# Patient Record
Sex: Female | Born: 1975 | Race: Black or African American | Hispanic: No | Smoking: Former smoker
Health system: Southern US, Community
[De-identification: ages and names within clinical notes are randomized; demographics above are authoritative.]

## PROBLEM LIST (undated history)

## (undated) DIAGNOSIS — F319 Bipolar disorder, unspecified: Secondary | ICD-10-CM

## (undated) DIAGNOSIS — F32A Depression, unspecified: Secondary | ICD-10-CM

## (undated) DIAGNOSIS — F329 Major depressive disorder, single episode, unspecified: Secondary | ICD-10-CM

## (undated) DIAGNOSIS — F4010 Social phobia, unspecified: Secondary | ICD-10-CM

## (undated) DIAGNOSIS — M1990 Unspecified osteoarthritis, unspecified site: Secondary | ICD-10-CM

## (undated) DIAGNOSIS — F4310 Post-traumatic stress disorder, unspecified: Secondary | ICD-10-CM

## (undated) DIAGNOSIS — E785 Hyperlipidemia, unspecified: Secondary | ICD-10-CM

## (undated) HISTORY — PX: TOOTH EXTRACTION: SUR596

## (undated) HISTORY — DX: Hyperlipidemia, unspecified: E78.5

## (undated) HISTORY — PX: TONSILLECTOMY: SUR1361

---

## 1998-07-30 ENCOUNTER — Emergency Department (HOSPITAL_COMMUNITY): Admission: EM | Admit: 1998-07-30 | Discharge: 1998-07-30 | Payer: Self-pay | Admitting: Emergency Medicine

## 1999-05-27 ENCOUNTER — Emergency Department (HOSPITAL_COMMUNITY): Admission: EM | Admit: 1999-05-27 | Discharge: 1999-05-27 | Payer: Self-pay | Admitting: Emergency Medicine

## 1999-05-27 ENCOUNTER — Encounter: Payer: Self-pay | Admitting: Emergency Medicine

## 1999-08-05 ENCOUNTER — Ambulatory Visit (HOSPITAL_COMMUNITY): Admission: RE | Admit: 1999-08-05 | Discharge: 1999-08-05 | Payer: Self-pay | Admitting: *Deleted

## 1999-10-20 ENCOUNTER — Ambulatory Visit (HOSPITAL_COMMUNITY): Admission: RE | Admit: 1999-10-20 | Discharge: 1999-10-20 | Payer: Self-pay | Admitting: Obstetrics

## 2000-02-10 ENCOUNTER — Inpatient Hospital Stay (HOSPITAL_COMMUNITY): Admission: AD | Admit: 2000-02-10 | Discharge: 2000-02-10 | Payer: Self-pay | Admitting: *Deleted

## 2000-02-13 ENCOUNTER — Encounter (HOSPITAL_COMMUNITY): Admission: RE | Admit: 2000-02-13 | Discharge: 2000-02-17 | Payer: Self-pay | Admitting: Obstetrics & Gynecology

## 2000-02-16 ENCOUNTER — Inpatient Hospital Stay (HOSPITAL_COMMUNITY): Admission: AD | Admit: 2000-02-16 | Discharge: 2000-02-20 | Payer: Self-pay | Admitting: Obstetrics

## 2000-02-16 ENCOUNTER — Encounter (INDEPENDENT_AMBULATORY_CARE_PROVIDER_SITE_OTHER): Payer: Self-pay

## 2000-02-16 ENCOUNTER — Inpatient Hospital Stay (HOSPITAL_COMMUNITY): Admission: AD | Admit: 2000-02-16 | Discharge: 2000-02-16 | Payer: Self-pay | Admitting: Obstetrics

## 2000-02-23 ENCOUNTER — Inpatient Hospital Stay (HOSPITAL_COMMUNITY): Admission: AD | Admit: 2000-02-23 | Discharge: 2000-02-23 | Payer: Self-pay | Admitting: *Deleted

## 2000-04-17 ENCOUNTER — Emergency Department (HOSPITAL_COMMUNITY): Admission: EM | Admit: 2000-04-17 | Discharge: 2000-04-17 | Payer: Self-pay | Admitting: Emergency Medicine

## 2001-09-20 ENCOUNTER — Emergency Department (HOSPITAL_COMMUNITY): Admission: EM | Admit: 2001-09-20 | Discharge: 2001-09-20 | Payer: Self-pay | Admitting: Emergency Medicine

## 2001-09-30 ENCOUNTER — Emergency Department (HOSPITAL_COMMUNITY): Admission: EM | Admit: 2001-09-30 | Discharge: 2001-09-30 | Payer: Self-pay | Admitting: Emergency Medicine

## 2002-03-12 ENCOUNTER — Emergency Department (HOSPITAL_COMMUNITY): Admission: EM | Admit: 2002-03-12 | Discharge: 2002-03-12 | Payer: Self-pay | Admitting: Emergency Medicine

## 2002-04-26 ENCOUNTER — Encounter: Payer: Self-pay | Admitting: Obstetrics and Gynecology

## 2002-04-26 ENCOUNTER — Inpatient Hospital Stay (HOSPITAL_COMMUNITY): Admission: AD | Admit: 2002-04-26 | Discharge: 2002-04-26 | Payer: Self-pay | Admitting: Obstetrics and Gynecology

## 2002-08-01 ENCOUNTER — Inpatient Hospital Stay (HOSPITAL_COMMUNITY): Admission: AD | Admit: 2002-08-01 | Discharge: 2002-08-01 | Payer: Self-pay | Admitting: *Deleted

## 2002-08-01 ENCOUNTER — Encounter: Payer: Self-pay | Admitting: *Deleted

## 2002-08-02 ENCOUNTER — Inpatient Hospital Stay (HOSPITAL_COMMUNITY): Admission: AD | Admit: 2002-08-02 | Discharge: 2002-08-02 | Payer: Self-pay | Admitting: Obstetrics and Gynecology

## 2002-08-03 ENCOUNTER — Inpatient Hospital Stay (HOSPITAL_COMMUNITY): Admission: AD | Admit: 2002-08-03 | Discharge: 2002-08-03 | Payer: Self-pay | Admitting: *Deleted

## 2002-08-07 ENCOUNTER — Ambulatory Visit (HOSPITAL_COMMUNITY): Admission: RE | Admit: 2002-08-07 | Discharge: 2002-08-07 | Payer: Self-pay | Admitting: *Deleted

## 2002-09-09 ENCOUNTER — Inpatient Hospital Stay (HOSPITAL_COMMUNITY): Admission: AD | Admit: 2002-09-09 | Discharge: 2002-09-09 | Payer: Self-pay | Admitting: *Deleted

## 2002-09-15 ENCOUNTER — Inpatient Hospital Stay (HOSPITAL_COMMUNITY): Admission: AD | Admit: 2002-09-15 | Discharge: 2002-09-15 | Payer: Self-pay | Admitting: Obstetrics and Gynecology

## 2002-09-17 ENCOUNTER — Encounter: Payer: Self-pay | Admitting: Obstetrics and Gynecology

## 2002-09-17 ENCOUNTER — Inpatient Hospital Stay (HOSPITAL_COMMUNITY): Admission: AD | Admit: 2002-09-17 | Discharge: 2002-09-17 | Payer: Self-pay | Admitting: Obstetrics and Gynecology

## 2002-09-21 ENCOUNTER — Encounter (HOSPITAL_COMMUNITY): Admission: RE | Admit: 2002-09-21 | Discharge: 2002-09-23 | Payer: Self-pay | Admitting: *Deleted

## 2002-09-25 ENCOUNTER — Inpatient Hospital Stay (HOSPITAL_COMMUNITY): Admission: AD | Admit: 2002-09-25 | Discharge: 2002-09-28 | Payer: Self-pay | Admitting: *Deleted

## 2002-09-25 ENCOUNTER — Encounter (INDEPENDENT_AMBULATORY_CARE_PROVIDER_SITE_OTHER): Payer: Self-pay

## 2002-10-02 ENCOUNTER — Inpatient Hospital Stay (HOSPITAL_COMMUNITY): Admission: AD | Admit: 2002-10-02 | Discharge: 2002-10-02 | Payer: Self-pay | Admitting: *Deleted

## 2006-04-10 ENCOUNTER — Emergency Department (HOSPITAL_COMMUNITY): Admission: EM | Admit: 2006-04-10 | Discharge: 2006-04-10 | Payer: Self-pay | Admitting: Emergency Medicine

## 2006-07-18 ENCOUNTER — Emergency Department (HOSPITAL_COMMUNITY): Admission: EM | Admit: 2006-07-18 | Discharge: 2006-07-18 | Payer: Self-pay | Admitting: Emergency Medicine

## 2007-11-16 ENCOUNTER — Emergency Department (HOSPITAL_COMMUNITY): Admission: EM | Admit: 2007-11-16 | Discharge: 2007-11-16 | Payer: Self-pay | Admitting: Emergency Medicine

## 2008-08-02 ENCOUNTER — Emergency Department (HOSPITAL_COMMUNITY): Admission: EM | Admit: 2008-08-02 | Discharge: 2008-08-02 | Payer: Self-pay | Admitting: Emergency Medicine

## 2010-01-15 ENCOUNTER — Emergency Department (HOSPITAL_COMMUNITY): Admission: EM | Admit: 2010-01-15 | Discharge: 2010-01-17 | Payer: Self-pay | Admitting: Emergency Medicine

## 2010-01-17 ENCOUNTER — Ambulatory Visit: Payer: Self-pay | Admitting: Psychiatry

## 2010-01-17 ENCOUNTER — Inpatient Hospital Stay (HOSPITAL_COMMUNITY): Admission: EM | Admit: 2010-01-17 | Discharge: 2010-01-22 | Payer: Self-pay | Admitting: Psychiatry

## 2010-04-28 ENCOUNTER — Emergency Department (HOSPITAL_COMMUNITY): Admission: EM | Admit: 2010-04-28 | Discharge: 2010-04-28 | Payer: Self-pay | Admitting: Emergency Medicine

## 2010-11-18 ENCOUNTER — Emergency Department (HOSPITAL_COMMUNITY): Admission: EM | Admit: 2010-11-18 | Discharge: 2010-11-18 | Payer: Self-pay | Admitting: Emergency Medicine

## 2011-03-11 ENCOUNTER — Emergency Department (HOSPITAL_COMMUNITY): Payer: Self-pay

## 2011-03-11 ENCOUNTER — Emergency Department (HOSPITAL_COMMUNITY)
Admission: EM | Admit: 2011-03-11 | Discharge: 2011-03-11 | Disposition: A | Discharge status: Home / Self Care | Payer: Self-pay | Attending: Emergency Medicine | Admitting: Emergency Medicine

## 2011-03-11 DIAGNOSIS — F319 Bipolar disorder, unspecified: Secondary | ICD-10-CM | POA: Insufficient documentation

## 2011-03-11 DIAGNOSIS — I10 Essential (primary) hypertension: Secondary | ICD-10-CM | POA: Insufficient documentation

## 2011-03-11 DIAGNOSIS — R109 Unspecified abdominal pain: Secondary | ICD-10-CM | POA: Insufficient documentation

## 2011-03-11 DIAGNOSIS — R1110 Vomiting, unspecified: Secondary | ICD-10-CM | POA: Insufficient documentation

## 2011-03-11 DIAGNOSIS — R197 Diarrhea, unspecified: Secondary | ICD-10-CM | POA: Insufficient documentation

## 2011-03-11 LAB — COMPREHENSIVE METABOLIC PANEL
ALT: 21 U/L (ref 0–35)
AST: 17 U/L (ref 0–37)
Albumin: 3.9 g/dL (ref 3.5–5.2)
Alkaline Phosphatase: 54 U/L (ref 39–117)
BUN: 10 mg/dL (ref 6–23)
Chloride: 107 mEq/L (ref 96–112)
Creatinine, Ser: 1.01 mg/dL (ref 0.4–1.2)
GFR calc Af Amer: >60 mL/min (ref >60)
GFR calc non Af Amer: >60 mL/min (ref >60)
Glucose, Bld: 81 mg/dL (ref 70–99)
Total Bilirubin: 0.4 mg/dL (ref 0.3–1.2)
Total Protein: 7.7 g/dL (ref 6.0–8.3)

## 2011-03-11 LAB — POCT PREGNANCY, URINE
Preg Test, Ur: NEGATIVE
Preg Test, Ur: NEGATIVE

## 2011-03-11 LAB — URINALYSIS, ROUTINE W REFLEX MICROSCOPIC
Glucose, UA: NEGATIVE mg/dL
Ketones, ur: NEGATIVE mg/dL
Leukocytes, UA: NEGATIVE
Specific Gravity, Urine: 1.026 (ref 1.005–1.030)
Urobilinogen, UA: 0.2 mg/dL (ref 0.0–1.0)

## 2011-03-11 LAB — CBC
MCHC: 34.3 g/dL (ref 30.0–36.0)
MCV: 96.9 fL (ref 78.0–100.0)
Platelets: 280 10*3/uL (ref 150–400)
RDW: 12.6 % (ref 11.5–15.5)

## 2011-03-11 LAB — DIFFERENTIAL: Basophils Absolute: 0 10*3/uL (ref 0.0–0.1)

## 2011-03-16 LAB — BASIC METABOLIC PANEL
CO2: 23 mEq/L (ref 19–32)
Calcium: 8.8 mg/dL (ref 8.4–10.5)
GFR calc Af Amer: >60 mL/min (ref >60)
GFR calc non Af Amer: >60 mL/min (ref >60)

## 2011-03-16 LAB — DIFFERENTIAL
Basophils Absolute: 0 10*3/uL (ref 0.0–0.1)
Eosinophils Absolute: 0 10*3/uL (ref 0.0–0.7)
Eosinophils Relative: 1 % (ref 0–5)
Lymphocytes Relative: 26 % (ref 12–46)
Monocytes Absolute: 0.3 10*3/uL (ref 0.1–1.0)
Monocytes Relative: 6 % (ref 3–12)

## 2011-03-16 LAB — TRICYCLICS SCREEN, URINE: TCA Scrn: NOT DETECTED

## 2011-03-16 LAB — CBC
Hemoglobin: 14.1 g/dL (ref 12.0–15.0)
Platelets: 259 10*3/uL (ref 150–400)
RBC: 4.22 MIL/uL (ref 3.87–5.11)
RDW: 14.2 % (ref 11.5–15.5)

## 2011-03-16 LAB — URINE MICROSCOPIC-ADD ON

## 2011-03-16 LAB — URINALYSIS, ROUTINE W REFLEX MICROSCOPIC
Bilirubin Urine: NEGATIVE
Glucose, UA: NEGATIVE mg/dL
Protein, ur: NEGATIVE mg/dL

## 2011-03-16 LAB — RAPID URINE DRUG SCREEN, HOSP PERFORMED
Amphetamines: NOT DETECTED
Barbiturates: NOT DETECTED

## 2011-03-16 LAB — PREGNANCY, URINE: Preg Test, Ur: NEGATIVE

## 2011-03-16 LAB — LIPID PANEL: Total CHOL/HDL Ratio: 3.4 RATIO

## 2011-03-17 LAB — CBC
HCT: 37.5 % (ref 36.0–46.0)
MCHC: 33.8 g/dL (ref 30.0–36.0)
RDW: 14.1 % (ref 11.5–15.5)

## 2011-03-17 LAB — URINALYSIS, ROUTINE W REFLEX MICROSCOPIC
Glucose, UA: NEGATIVE mg/dL
Leukocytes, UA: NEGATIVE
Nitrite: NEGATIVE
Protein, ur: NEGATIVE mg/dL
Specific Gravity, Urine: 1.017 (ref 1.005–1.030)
pH: 5.5 (ref 5.0–8.0)

## 2011-03-17 LAB — DIFFERENTIAL
Eosinophils Relative: 1 % (ref 0–5)
Lymphocytes Relative: 15 % (ref 12–46)
Lymphs Abs: 1.2 10*3/uL (ref 0.7–4.0)
Neutro Abs: 6.5 10*3/uL (ref 1.7–7.7)

## 2011-03-17 LAB — POCT I-STAT, CHEM 8
BUN: 11 mg/dL (ref 6–23)
Calcium, Ion: 1.13 mmol/L (ref 1.12–1.32)
Creatinine, Ser: 1.3 mg/dL — ABNORMAL HIGH (ref 0.4–1.2)
Glucose, Bld: 90 mg/dL (ref 70–99)
HCT: 40 % (ref 36.0–46.0)
TCO2: 22 mmol/L (ref 0–100)

## 2011-03-17 LAB — URINE MICROSCOPIC-ADD ON

## 2011-03-17 LAB — RAPID URINE DRUG SCREEN, HOSP PERFORMED
Benzodiazepines: NOT DETECTED
Cocaine: NOT DETECTED
Opiates: NOT DETECTED

## 2011-03-17 LAB — POCT PREGNANCY, URINE: Preg Test, Ur: NEGATIVE

## 2011-05-15 NOTE — Discharge Summary (Signed)
   [name], Grace Carson                         ACCOUNT NO.:  [id number]   MEDICAL RECORD NO.:  [id number]                   PATIENT TYPE:  INP   LOCATION:  9101                                 FACILITY:  [facility]   PHYSICIAN:  Mary Sella. Orlene Erm, M.D.                 DATE OF BIRTH:  02/27/1976   DATE OF ADMISSION:  09/25/2002  DATE OF DISCHARGE:  09/28/2002                                 DISCHARGE SUMMARY   DISCHARGE DIAGNOSES:  1. Status post low transverse cesarean section.  2. Delivery of a viable female infant.  3. Status post bilateral tubal ligation.   DISCHARGE MEDICATIONS:  1. Ibuprofen 600 mg p.o. q.6h. p.r.n. pain.  2. Percocet 5/325 one to two tablets p.o. q.4-6h. p.r.n. severe pain.  3. Prenatal vitamins one tablet p.o. q.d. x6 weeks or while breast-feeding.   ADMISSION HISTORY AND PHYSICAL:  A 35 year old G3, P2 presenting at 58 and [redacted]  weeks gestation for follow-up for an unreactive NST.  Pregnancy complicated  by anemia of pregnancy, history of Chlamydia.   PRENATAL LABORATORIES:  B+.  Antibody negative.  Rubella immune.  Hepatitis  B surface antigen negative.  RPR negative.  HIV negative.  GC negative.  Chlamydia negative.  GBS positive.   HOSPITAL COURSE:  The patient admitted for a repeat LTCS and BTL as she was  in labor on admission.  The patient tolerated the procedure well and did  have a BTL at the same time as delivery of her infant.  Baby was circumcised  prior to discharge.  The patient did well throughout her hospital stay and  did have a scheduled staple removal in the MAU on Monday, October 6.  The  patient developed no other postpartum complications.   DISCHARGE LABORATORIES:  Surgical pathology shows two fallopian tubes with  complete transections and no pathological abnormalities.  WBC 9.5,  hemoglobin 10.9, platelets 205,000.  RPR negative.  Ultrasound on September  29 showed a grade 2 placenta with normal AFI and a cephalic presentation.  The patient  discharged to home without further incident.     Jonah Blue, M.D.                      Mary Sella. Orlene Erm, M.D.    Milas Gain  D:  10/22/2002  T:  10/23/2002  Job:  [id number]   cc:   [facility]

## 2011-05-15 NOTE — Op Note (Signed)
[facility]  Patient:    Grace Carson, Grace Carson                      MRN: [id number] Proc. Date: 02/17/00 Adm. Date:  [id number] Disc. Date: [id number] Attending:  Osvaldo Human                           Operative Report  PREOPERATIVE DIAGNOSIS:       Intrauterine pregnancy at term with nonreassuring  fetal heart rate and prior cesarean delivery.  POSTOPERATIVE DIAGNOSIS:      Intrauterine pregnancy at term with nonreassuring  fetal heart rate and prior cesarean delivery.  OPERATION:                    Low transverse cesarean.  SURGEON:                      Conni Elliot, M.D. and Dr. Philis Fendt.  ANESTHESIA:                   ______ epidural.  OPERATIVE FINDINGS:           Female infant with 7 pounds 15 ounces with Apgars of 8 and 9 at one and five minutes respectively.  Cord pH 7.28.  Placenta was sent to pathology.  PROCEDURE:                    After the patient was supine in left tilt position receiving oxygen, was prepped and draped in a sterile fashion.  Low transverse Pfannenstiel incision was made.  Incision made through the skin, fascia. Rectus muscles separated in the midline.  ______.  Bladder flap created.  Low transuterine incision was made.  Baby delivered in vertex presentation.  Cord double clamped and cut and baby handed to the neonatologists in attendance. Placenta was delivered spontaneously.  Uterus, bladder flap, ______, fascia, skin closed in a routine fashion.  Estimated blood loss approximately 500-750 cc. Needle, sponge were correct. DD:  04/25/00 TD:  04/25/00 Job: [id number]

## 2011-05-15 NOTE — Discharge Summary (Signed)
[facility]  Patient:    TRISTYN, PHARRIS                      MRN: [id number] Adm. Date:  [id number] Disc. Date: [id number] Attending:  Osvaldo Human Dictator:   Brandt Loosen, M.D.                           Discharge Summary  DISCHARGE DIAGNOSES:          1. Intrauterine pregnancy.                               2. Delivered via repeat cesarean section.                               3. Induction of labor for nonreassuring nonstress                                  test.  HOSPITAL COURSE:              This gravida 2, para 1-0-0-1, at 41-1/7 weeks was  admitted with a nonreactive NST at [facility].  She was admitted to the labor and delivery floor and given prostin gel x 2 overnight and then begun on low dose Pitocin per low dose Pitocin protocol.  She was continued on the Pitocin.  She continued to have nonreassuring fetal heart tones and because of her slow progression, it was decided to perform a repeat cesarean section as she was a VBAC. She had a viable infant female that was delivered.  Apgars were 8 at one minute and 9 at five minutes.  Weight was 7 pounds 15 ounces and stable.  Postoperative course was unremarkable.  Staples were removed.  She was bottlefeeding and using oral contraceptives for birth control at discharge. DD:  06/11/00 TD:  06/15/00 Job: [id number]

## 2011-05-15 NOTE — Op Note (Signed)
Grace Carson, Grace Carson                         ACCOUNT NO.:  [id number]   MEDICAL RECORD NO.:  [id number]                   PATIENT TYPE:  INP   LOCATION:  9101                                 FACILITY:  [facility]   PHYSICIAN:  Mary Sella. Orlene Erm, M.D.                 DATE OF BIRTH:  1976/07/23   DATE OF PROCEDURE:  09/25/2002  DATE OF DISCHARGE:                                 OPERATIVE REPORT   PREOPERATIVE DIAGNOSES:  A 35 year old gravida 3, para 2 black female with  two previous cesarean sections in active labor desiring repeat cesarean  section and tubal ligation.   POSTOPERATIVE DIAGNOSES:  A 35 year old gravida 3, para 2 black female with  two previous cesarean sections in active labor desiring repeat cesarean  section and tubal ligation.   PROCEDURE:  1. Repeat low transverse cesarean section.  2. Bilateral tubal ligation.   SURGEON:  Mary Sella. Orlene Erm, M.D.   ASSISTANT:  Alvira Philips, M.D.   ANESTHESIA:  Spinal.   COMPLICATIONS:  None.   ESTIMATED BLOOD LOSS:  500 cc.   FINDINGS:  Viable female infant delivered with Apgars of 8 and 9.  Normal  uterus, tubes, and ovaries.  Dense fascial scarring.   PROCEDURE:  The patient was taken to the operating room where she was given  spinal anesthesia.  She was prepped and draped in a sterile fashion.  A  Pfannenstiel incision was performed with the scalpel and carried down to  underlying fascia.  Fascia was Bovied.  The fascia was entered sharply and  dissected laterally with Mayo scissors.  The fascia was carefully dissected  away from the underlying rectus muscle bellies with sharp and blunt  dissection.  A Hemostat was used to spread the rectus muscle bellies and  identify a peritoneal window.  The peritoneum was entered sharply.  The  midline scar was then dissected superiorly and inferiorly with the Bovie and  Mayo scissors.  The bladder blade was placed and bladder flap was created  with sharp and blunt dissection.  The bladder  blade was replaced and the  uterus was scored with a scalpel blade.  The uterine incision was carried  down on the midline with the scalpel and the surgeon's fingers.  The uterus  was entered sharply and clear amniotic fluid was found.  The infant's head  was delivered through the operative field.  The muscle bellies were quite  scarred and there was dystocia of the infant's head through the rectus  muscle bellies.  A vacuum was placed on the infant's occiput and the head  was easily delivered through the uterine incision.  The infant was bulb  suctioned on the operative field.  The infant's cord was clamped and cut and  the infant was handed to the awaiting neonatal resuscitation team.  The  placenta was then manually extracted and the uterus was curetted with a dry  lap sponge.  The uterus was externalized and the uterine incision was  repaired with a running locking suture of 0 chromic.  Attention was then  turned to the patient's fallopian tubes and the fallopian tubes were grasped  with a Babcock clamp and the left tube was doubly ligated with 0 plain gut  sutures.  A 1 cm segment of tube was excised.  The patient's right fallopian  tube was ligated in a similar fashion.  A 1 cm segment of tube was excised.  The uterus was returned to the abdominal cavity and the tubal sites were  inspected and noted to be hemostatic.  The pericolic gutters were emptied of  clot and debris.  The uterine incision was inspected and made hemostatic.  The fascia was closed with a running stitch of 0 Vicryl.  The subcutaneous  tissues were irrigated with copious amounts of saline.  Skin edges were  reapproximated with staples.  Sponge, instrument, and needle counts were  correct at the end of the case.                                               Mary Sella. Orlene Erm, M.D.    EMH/MEDQ  D:  09/25/2002  T:  09/26/2002  Job:  [id number]

## 2011-09-24 ENCOUNTER — Emergency Department (HOSPITAL_COMMUNITY)
Admission: EM | Admit: 2011-09-24 | Discharge: 2011-09-24 | Disposition: A | Discharge status: Home / Self Care | Payer: Self-pay | Attending: Emergency Medicine | Admitting: Emergency Medicine

## 2011-09-24 DIAGNOSIS — R221 Localized swelling, mass and lump, neck: Secondary | ICD-10-CM | POA: Insufficient documentation

## 2011-09-24 DIAGNOSIS — Z79899 Other long term (current) drug therapy: Secondary | ICD-10-CM | POA: Insufficient documentation

## 2011-09-24 DIAGNOSIS — F3130 Bipolar disorder, current episode depressed, mild or moderate severity, unspecified: Secondary | ICD-10-CM | POA: Insufficient documentation

## 2011-09-24 DIAGNOSIS — I10 Essential (primary) hypertension: Secondary | ICD-10-CM | POA: Insufficient documentation

## 2011-09-24 DIAGNOSIS — R220 Localized swelling, mass and lump, head: Secondary | ICD-10-CM | POA: Insufficient documentation

## 2011-09-24 DIAGNOSIS — X58XXXA Exposure to other specified factors, initial encounter: Secondary | ICD-10-CM | POA: Insufficient documentation

## 2011-09-24 DIAGNOSIS — R599 Enlarged lymph nodes, unspecified: Secondary | ICD-10-CM | POA: Insufficient documentation

## 2011-09-24 DIAGNOSIS — I889 Nonspecific lymphadenitis, unspecified: Secondary | ICD-10-CM | POA: Insufficient documentation

## 2011-09-24 DIAGNOSIS — IMO0002 Reserved for concepts with insufficient information to code with codable children: Secondary | ICD-10-CM | POA: Insufficient documentation

## 2011-09-24 LAB — BASIC METABOLIC PANEL
BUN: 10 mg/dL (ref 6–23)
CO2: 27 mEq/L (ref 19–32)
Chloride: 105 mEq/L (ref 96–112)
Creatinine, Ser: 0.83 mg/dL (ref 0.50–1.10)
GFR calc Af Amer: >60 mL/min (ref >60)
Potassium: 4.3 mEq/L (ref 3.5–5.1)

## 2011-09-24 LAB — CBC
HCT: 39.4 % (ref 36.0–46.0)
MCV: 95.9 fL (ref 78.0–100.0)
RBC: 4.11 MIL/uL (ref 3.87–5.11)
WBC: 6.4 10*3/uL (ref 4.0–10.5)

## 2011-09-24 LAB — DIFFERENTIAL
Basophils Absolute: 0 10*3/uL (ref 0.0–0.1)
Lymphocytes Relative: 23 % (ref 12–46)
Lymphs Abs: 1.5 10*3/uL (ref 0.7–4.0)
Neutro Abs: 4.3 10*3/uL (ref 1.7–7.7)
Neutrophils Relative %: 67 % (ref 43–77)

## 2011-10-06 LAB — URINE MICROSCOPIC-ADD ON

## 2011-10-06 LAB — URINALYSIS, ROUTINE W REFLEX MICROSCOPIC
Glucose, UA: NEGATIVE
Ketones, ur: NEGATIVE
Nitrite: NEGATIVE
Specific Gravity, Urine: 1.017
pH: 6

## 2012-06-14 ENCOUNTER — Encounter (HOSPITAL_COMMUNITY): Payer: Self-pay | Admitting: *Deleted

## 2012-06-14 ENCOUNTER — Emergency Department (HOSPITAL_COMMUNITY)
Admission: EM | Admit: 2012-06-14 | Discharge: 2012-06-14 | Disposition: A | Discharge status: Home / Self Care | Payer: Medicaid Other | Attending: Emergency Medicine | Admitting: Emergency Medicine

## 2012-06-14 DIAGNOSIS — Z79899 Other long term (current) drug therapy: Secondary | ICD-10-CM | POA: Insufficient documentation

## 2012-06-14 DIAGNOSIS — L0291 Cutaneous abscess, unspecified: Secondary | ICD-10-CM

## 2012-06-14 DIAGNOSIS — L0390 Cellulitis, unspecified: Secondary | ICD-10-CM

## 2012-06-14 DIAGNOSIS — F17200 Nicotine dependence, unspecified, uncomplicated: Secondary | ICD-10-CM | POA: Insufficient documentation

## 2012-06-14 DIAGNOSIS — L02419 Cutaneous abscess of limb, unspecified: Secondary | ICD-10-CM | POA: Insufficient documentation

## 2012-06-14 HISTORY — DX: Social phobia, unspecified: F40.10

## 2012-06-14 MED ORDER — KETOROLAC TROMETHAMINE 60 MG/2ML IM SOLN
60.0000 mg | Freq: Once | INTRAMUSCULAR | Status: AC
Start: 2012-06-14 — End: 2012-06-14
  Administered 2012-06-14: 60 mg via INTRAMUSCULAR
  Filled 2012-06-14: qty 2

## 2012-06-14 MED ORDER — SULFAMETHOXAZOLE-TRIMETHOPRIM 800-160 MG PO TABS: 1.0000 | ORAL_TABLET | Freq: Two times a day (BID) | ORAL | Status: AC

## 2012-06-14 MED ORDER — OXYCODONE-ACETAMINOPHEN 5-325 MG PO TABS
2.0000 | ORAL_TABLET | Freq: Once | ORAL | Status: AC
  Administered 2012-06-14: 2 via ORAL
  Filled 2012-06-14: qty 2

## 2012-06-14 MED ORDER — SULFAMETHOXAZOLE-TRIMETHOPRIM 800-160 MG PO TABS: 1.0000 | ORAL_TABLET | Freq: Two times a day (BID) | ORAL | Status: DC

## 2012-06-14 MED ORDER — LIDOCAINE HCL 2 % IJ SOLN
10.0000 mL | Freq: Once | INTRAMUSCULAR | Status: AC
  Administered 2012-06-14: 20 mg via INTRADERMAL
  Filled 2012-06-14: qty 1

## 2012-06-14 NOTE — ED Provider Notes (Signed)
History     CSN: [id number]  Arrival date & time 06/14/12  4098   First MD Initiated Contact with Patient 06/14/12 1207      Chief Complaint  Patient presents with  . Abscess    R upper thigh     (Consider location/radiation/quality/duration/timing/severity/associated sxs/prior treatment) Patient is a 36 y.o. female presenting with abscess. The history is provided by the patient and a friend. No language interpreter was used.  Abscess  This is a new problem. The current episode started today. The problem occurs rarely. The problem has been gradually worsening. The abscess is present on the right upper leg. The problem is moderate. The abscess is characterized by redness, painfulness and swelling. The abscess first occurred at home. Pertinent negatives include no fever, no fussiness and no vomiting.   abscess to right posterior thigh patient noticed it today. The size of golf ball.  PMH of abscess under arm and buttocks.    Past Medical History  Diagnosis Date  . Social anxiety disorder     Past Surgical History  Procedure Date  . Tooth extraction   . Cesarean section     No family history on file.  History  Substance Use Topics  . Smoking status: Current Everyday Smoker -- 0.5 packs/day    Types: Cigarettes  . Smokeless tobacco: Not on file  . Alcohol Use: Yes     occa    OB History    Grav Para Term Preterm Abortions TAB SAB Ect Mult Living                  Review of Systems  Constitutional: Negative.  Negative for fever and diaphoresis.  HENT: Negative.   Eyes: Negative.   Respiratory: Negative.  Negative for shortness of breath.   Cardiovascular: Negative.   Gastrointestinal: Negative.  Negative for nausea and vomiting.  Skin:       Abscess to posterior R upper thigh  Neurological: Negative.  Negative for dizziness, weakness and light-headedness.  Psychiatric/Behavioral: Negative.   All other systems reviewed and are negative.    Allergies  Orange  juice  Home Medications   Current Outpatient Rx  Name Route Sig Dispense Refill  . BUPROPION HCL ER (SR) 150 MG PO TB12 Oral Take 150 mg by mouth daily.    Marland Kitchen CARBAMAZEPINE 200 MG PO TABS Oral Take 200 mg by mouth at bedtime.    Marland Kitchen CLONAZEPAM 1 MG PO TABS Oral Take 1 mg by mouth See admin instructions. Take 1 tablet in the morning and 1 tablet in the evening and 1/2 tablet at bedtime    . TRAZODONE HCL 150 MG PO TABS Oral Take 300 mg by mouth at bedtime.    . SULFAMETHOXAZOLE-TRIMETHOPRIM 800-160 MG PO TABS Oral Take 1 tablet by mouth every 12 (twelve) hours. 10 tablet 0    LMP 06/12/2012  Physical Exam  Nursing note and vitals reviewed. Constitutional: She is oriented to person, place, and time. She appears well-developed and well-nourished.  HENT:  Head: Normocephalic and atraumatic.  Eyes: Conjunctivae and EOM are normal. Pupils are equal, round, and reactive to light.  Neck: Normal range of motion. Neck supple.  Cardiovascular: Normal rate.   Pulmonary/Chest: Effort normal.  Abdominal: Soft.  Musculoskeletal: Normal range of motion. She exhibits no edema and no tenderness.  Neurological: She is alert and oriented to person, place, and time. She has normal reflexes.  Skin: Skin is warm and dry.       Abscess  to R posterior thigh with fluctuance  Tender.   Psychiatric: Her speech is normal. Judgment normal. Her affect is angry. She is agitated. Cognition and memory are normal.       Angry she had to wait for procedure.  No eye contact.     ED Course  INCISION AND DRAINAGE Date/Time: 06/14/2012 12:24 PM Performed by: Remi Haggard Authorized by: Remi Haggard Consent: Verbal consent obtained. Written consent not obtained. Risks and benefits: risks, benefits and alternatives were discussed Consent given by: patient Patient understanding: patient states understanding of the procedure being performed Patient identity confirmed: verbally with patient, arm band, provided  demographic data and hospital-assigned identification number Time out: Immediately prior to procedure a "time out" was called to verify the correct patient, procedure, equipment, support staff and site/side marked as required. Type: abscess Anesthesia: local infiltration Local anesthetic: lidocaine 2% without epinephrine Anesthetic total: 6 ml Patient sedated: no Scalpel size: 11 Needle gauge: 22 Incision type: single straight Complexity: simple Drainage: purulent Drainage amount: moderate Wound treatment: wound left open Packing material: 1/4 in iodoform gauze Patient tolerance: Patient tolerated the procedure well with no immediate complications.   (including critical care time)  Labs Reviewed - No data to display No results found.   1. Abscess   2. Cellulitis       MDM   I & D abscess posterior R thigh. Recheck in 2 days.  Bactrim ds rx and percocet.       Remi Haggard, NP 06/15/12 1228

## 2012-06-14 NOTE — ED Notes (Signed)
Per EMS, pt reports waking up this am with R upper thigh pain that radiates to her R flank area.  No injury.  Able to bear weight and ambulate.  Pt reports feeling a mass in the area as well-painful with palpation.

## 2012-06-14 NOTE — Progress Notes (Signed)
Pt listed as self pay with no insurance coverage Pt confirms he is self pay [place] resident CM and [facility] community liaison spoke with him Pt offered [facility] services to assist with finding a [place] self pay provider Pt refused information  States he is not interested Awaiting a disability hearing

## 2012-06-14 NOTE — Discharge Instructions (Signed)
Ms Gibeault we drained an abscess with infection all around it called cellulitis.  Leave the packing in until you are rechecked in 2 days.  The packing may fall out and that is ok. Take ibuprofen 800mg  every 6 hours with food.    Use hot compresses 3 times a day and squeeze. Put a fresh Band-Aid on each time. He can return to the ER or urgent care the meds as can for recheck. Start the antibiotics today. Return for severe pain or uncontrolled bleeding or high fever.   Cellulitis Cellulitis is an infection of the tissue under the skin. The infected area is usually red and tender. This is caused by germs. These germs enter the body through cuts or sores. This usually happens in the arms or lower legs. HOME CARE   Take your medicine as told. Finish it even if you start to feel better.   If the infection is on the arm or leg, keep it raised (elevated).   Use a warm cloth on the infected area several times a day.   See your doctor for a follow-up visit as told.  GET HELP RIGHT AWAY IF:   You are tired or confused.   You throw up (vomit).   You have watery poop (diarrhea).   You feel ill and have muscle aches.   You have a fever.  MAKE SURE YOU:   Understand these instructions.   Will watch your condition.   Will get help right away if you are not doing well or get worse.  Document Released: 06/01/2008 Document Revised: 12/03/2011 Document Reviewed: 11/15/2009 [facility] Patient Information 2012 [place], [place].Abscess An abscess (boil or furuncle) is an infected area under your skin. This area is filled with yellowish white fluid (pus). HOME CARE   Only take medicine as told by your doctor.   Keep the skin clean around your abscess. Keep clothes that may touch the abscess clean.   Change any bandages (dressings) as told by your doctor.   Avoid direct skin contact with other people. The infection can spread by skin contact with others.   Practice good hygiene and do not share  personal care items.   Do not share athletic equipment, towels, or whirlpools. Shower after every practice or work out session.   If a draining area cannot be covered:   Do not play sports.   Children should not go to daycare until the wound has healed or until fluid (drainage) stops coming out of the wound.   See your doctor for a follow-up visit as told.  GET HELP RIGHT AWAY IF:   There is more pain, puffiness (swelling), and redness in the wound site.   There is fluid or bleeding from the wound site.   You have muscle aches, chills, fever, or feel sick.   You or your child has a temperature by mouth above 102 F (38.9 C), not controlled by medicine.   Your baby is older than 3 months with a rectal temperature of 102 F (38.9 C) or higher.  MAKE SURE YOU:   Understand these instructions.   Will watch your condition.   Will get help right away if you are not doing well or get worse.  Document Released: 06/01/2008 Document Revised: 12/03/2011 Document Reviewed: 06/01/2008 [facility] Patient Information 2012 [place], [place].

## 2012-06-16 NOTE — ED Provider Notes (Signed)
Medical screening examination/treatment/procedure(s) were performed by non-physician practitioner and as supervising physician I was immediately available for consultation/collaboration.   Shawnae Leiva M Bellamarie Pflug, MD 06/16/12 1331 

## 2012-07-20 ENCOUNTER — Encounter (HOSPITAL_COMMUNITY): Payer: Self-pay

## 2012-07-20 ENCOUNTER — Emergency Department (HOSPITAL_COMMUNITY)
Admission: EM | Admit: 2012-07-20 | Discharge: 2012-07-20 | Disposition: A | Discharge status: Home / Self Care | Payer: Medicaid Other | Attending: Emergency Medicine | Admitting: Emergency Medicine

## 2012-07-20 DIAGNOSIS — L0231 Cutaneous abscess of buttock: Secondary | ICD-10-CM | POA: Insufficient documentation

## 2012-07-20 DIAGNOSIS — L03317 Cellulitis of buttock: Secondary | ICD-10-CM | POA: Insufficient documentation

## 2012-07-20 DIAGNOSIS — F17200 Nicotine dependence, unspecified, uncomplicated: Secondary | ICD-10-CM | POA: Insufficient documentation

## 2012-07-20 DIAGNOSIS — L0291 Cutaneous abscess, unspecified: Secondary | ICD-10-CM

## 2012-07-20 MED ORDER — OXYCODONE-ACETAMINOPHEN 5-325 MG PO TABS
1.0000 | ORAL_TABLET | Freq: Once | ORAL | Status: DC
  Filled 2012-07-20: qty 1

## 2012-07-20 MED ORDER — OXYCODONE-ACETAMINOPHEN 5-325 MG PO TABS
1.0000 | ORAL_TABLET | Freq: Once | ORAL | Status: AC
  Administered 2012-07-20: 1 via ORAL
  Filled 2012-07-20: qty 1

## 2012-07-20 MED ORDER — SULFAMETHOXAZOLE-TMP DS 800-160 MG PO TABS
1.0000 | ORAL_TABLET | Freq: Once | ORAL | Status: AC
Start: 2012-07-20 — End: 2012-07-20
  Administered 2012-07-20: 1 via ORAL
  Filled 2012-07-20: qty 1

## 2012-07-20 NOTE — ED Provider Notes (Signed)
INCISION AND DRAINAGE Performed by: Johnnette Gourd Consent: Verbal consent obtained. Risks and benefits: risks, benefits and alternatives were discussed Type: abscess  Body area: left inner buttock  Anesthesia: local infiltration  Local anesthetic: lidocaine 2% with epinephrine  Anesthetic total: 5 ml  Complexity: complex Blunt dissection to break up loculations  Drainage: purulent  Drainage amount: large amount using suction  Packing material: 1/2 in iodoform gauze  Patient tolerance: Patient tolerated the procedure well with no immediate complications.     Trevor Mace, PA-C 07/20/12 [phone]

## 2012-07-20 NOTE — ED Notes (Signed)
Unable to locate pt  

## 2012-07-20 NOTE — ED Provider Notes (Signed)
Medical screening examination/treatment/procedure(s) were performed by non-physician practitioner and as supervising physician I was immediately available for consultation/collaboration.   Charles B. Bernette Mayers, MD 07/20/12 [phone]

## 2012-07-20 NOTE — ED Provider Notes (Signed)
History     CSN: [id number]  Arrival date & time 07/20/12  4098   First MD Initiated Contact with Patient 07/20/12 [phone]      Chief Complaint  Patient presents with  . Abscess    (Consider location/radiation/quality/duration/timing/severity/associated sxs/prior treatment) Patient is a 37 y.o. female presenting with abscess. The history is provided by the patient.  Abscess  This is a new problem. The current episode started less than one week ago. The problem occurs frequently. The abscess is present on the left buttock. The problem is moderate. The abscess is characterized by painfulness and swelling. Pertinent negatives include no fever. Associated symptoms comments: Swollen, painful area to left buttock like many previous cutaneous abscesses in the past. There has been no drainage. No fever. Last episode was June, 2013.Marland Kitchen    Past Medical History  Diagnosis Date  . Social anxiety disorder     Past Surgical History  Procedure Date  . Tooth extraction   . Cesarean section     No family history on file.  History  Substance Use Topics  . Smoking status: Current Everyday Smoker -- 0.5 packs/day    Types: Cigarettes  . Smokeless tobacco: Not on file  . Alcohol Use: Yes     occa    OB History    Grav Para Term Preterm Abortions TAB SAB Ect Mult Living                  Review of Systems  Constitutional: Negative for fever.  Skin:       See HPI.    Allergies  Orange juice  Home Medications   Current Outpatient Rx  Name Route Sig Dispense Refill  . BUPROPION HCL ER (SR) 150 MG PO TB12 Oral Take 150 mg by mouth daily.    Marland Kitchen CARBAMAZEPINE 200 MG PO TABS Oral Take 200 mg by mouth at bedtime.    Marland Kitchen CLONAZEPAM 1 MG PO TABS Oral Take 1 mg by mouth See admin instructions. Take 1 tablet in the morning and 1 tablet in the evening and 1/2 tablet at bedtime    . TRAZODONE HCL 150 MG PO TABS Oral Take 300 mg by mouth at bedtime.      There were no vitals taken for this  visit.  Physical Exam  Constitutional: She is oriented to person, place, and time. She appears well-developed and well-nourished. No distress.  Neurological: She is alert and oriented to person, place, and time.  Skin: Skin is warm and dry.       Raised, swollen, significantly tender area of fluctuance approximately 4 x 3 cm to inner left buttock c/w cutaneous abscess. No active drainage. Non-tender perirectal area.     ED Course  Procedures (including critical care time)   Labs Reviewed  CULTURE, ROUTINE-ABSCESS   No results found.   No diagnosis found. 1. Cutaneous abscess    MDM  Abscess I&D'd by Johnnette Gourd, PA-C. Pain medications given.   Patient not in room when attempting recheck. Discharge AMA        Rodena Medin, PA-C 07/20/12 [phone]

## 2012-07-20 NOTE — ED Notes (Signed)
Pt here for abscess to left buttock, onset several days ago, no drainage noted, sts increasing in size and pain, hx of same.

## 2012-07-23 LAB — CULTURE, ROUTINE-ABSCESS: Gram Stain: NONE SEEN

## 2012-10-26 ENCOUNTER — Encounter (HOSPITAL_COMMUNITY): Payer: Self-pay | Admitting: Emergency Medicine

## 2012-10-26 ENCOUNTER — Emergency Department (HOSPITAL_COMMUNITY)
Admission: EM | Admit: 2012-10-26 | Discharge: 2012-10-26 | Disposition: A | Discharge status: Home / Self Care | Payer: Medicaid Other | Attending: Emergency Medicine | Admitting: Emergency Medicine

## 2012-10-26 DIAGNOSIS — R569 Unspecified convulsions: Secondary | ICD-10-CM | POA: Insufficient documentation

## 2012-10-26 DIAGNOSIS — F411 Generalized anxiety disorder: Secondary | ICD-10-CM | POA: Insufficient documentation

## 2012-10-26 DIAGNOSIS — Z79899 Other long term (current) drug therapy: Secondary | ICD-10-CM | POA: Insufficient documentation

## 2012-10-26 DIAGNOSIS — F17200 Nicotine dependence, unspecified, uncomplicated: Secondary | ICD-10-CM | POA: Insufficient documentation
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
## 2012-10-27 NOTE — ED Provider Notes (Signed)
Medical screening examination/treatment/procedure(s) were performed by non-physician practitioner and as supervising physician I was immediately available for consultation/collaboration.  Raeford Razor, MD 10/27/12 [phone]

## 2013-02-20 ENCOUNTER — Emergency Department (HOSPITAL_COMMUNITY)
Admission: EM | Admit: 2013-02-20 | Discharge: 2013-02-20 | Disposition: A | Discharge status: Home / Self Care | Payer: Medicaid Other | Attending: Emergency Medicine | Admitting: Emergency Medicine

## 2013-02-20 DIAGNOSIS — Z79899 Other long term (current) drug therapy: Secondary | ICD-10-CM | POA: Insufficient documentation

## 2013-02-20 DIAGNOSIS — F17200 Nicotine dependence, unspecified, uncomplicated: Secondary | ICD-10-CM | POA: Insufficient documentation

## 2013-02-20 DIAGNOSIS — F411 Generalized anxiety disorder: Secondary | ICD-10-CM | POA: Insufficient documentation

## 2013-02-20 DIAGNOSIS — S0180XA Unspecified open wound of other part of head, initial encounter: Secondary | ICD-10-CM | POA: Insufficient documentation

## 2013-02-20 MED ORDER — OXYCODONE-ACETAMINOPHEN 5-325 MG PO TABS
2.0000 | ORAL_TABLET | Freq: Once | ORAL | Status: AC
  Administered 2013-02-20: 2 via ORAL
  Filled 2013-02-20: qty 2

## 2013-02-20 MED ORDER — HYDROCODONE-ACETAMINOPHEN 5-325 MG PO TABS: 1.0000 | ORAL_TABLET | Freq: Four times a day (QID) | ORAL | Status: DC | PRN
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
## 2013-02-20 NOTE — ED Notes (Signed)
PA at bedside inserting stiches. Pt tolerating well. Family at bedside.

## 2013-02-21 ENCOUNTER — Encounter (HOSPITAL_COMMUNITY): Payer: Self-pay | Admitting: Emergency Medicine

## 2013-02-21 ENCOUNTER — Emergency Department (HOSPITAL_COMMUNITY)
Admission: EM | Admit: 2013-02-21 | Discharge: 2013-02-21 | Disposition: A | Discharge status: Home / Self Care | Payer: Medicaid Other | Attending: Emergency Medicine | Admitting: Emergency Medicine

## 2013-02-21 ENCOUNTER — Telehealth (HOSPITAL_COMMUNITY): Payer: Self-pay | Admitting: Emergency Medicine

## 2013-02-21 DIAGNOSIS — Z8659 Personal history of other mental and behavioral disorders: Secondary | ICD-10-CM | POA: Insufficient documentation

## 2013-02-21 DIAGNOSIS — F17200 Nicotine dependence, unspecified, uncomplicated: Secondary | ICD-10-CM | POA: Insufficient documentation

## 2013-02-21 DIAGNOSIS — Z23 Encounter for immunization: Secondary | ICD-10-CM | POA: Insufficient documentation

## 2013-02-21 MED ORDER — TETANUS-DIPHTH-ACELL PERTUSSIS 5-2.5-18.5 LF-MCG/0.5 IM SUSP
0.5000 mL | Freq: Once | INTRAMUSCULAR | Status: AC
  Administered 2013-02-21: 0.5 mL via INTRAMUSCULAR
  Filled 2013-02-21: qty 0.5

## 2013-02-21 NOTE — ED Notes (Signed)
Pt here yesterday for head laceration and needs to get TD

## 2013-02-21 NOTE — ED Notes (Signed)
Laceration clean, dry and intact; no signs of inflammation;

## 2013-02-21 NOTE — ED Provider Notes (Signed)
Medical screening examination/treatment/procedure(s) were performed by non-physician practitioner and as supervising physician I was immediately available for consultation/collaboration.   Suzi Roots, MD 02/21/13 [phone]
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
## 2013-02-21 NOTE — ED Provider Notes (Signed)
Medical screening examination/treatment/procedure(s) were performed by non-physician practitioner and as supervising physician I was immediately available for consultation/collaboration.   Etheridge Geil E Leliana Kontz, MD 02/21/13 1158 

## 2013-02-21 NOTE — ED Notes (Signed)
PA at bedside with pt. 
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
## 2013-02-21 NOTE — ED Notes (Signed)
Pt returned call.  Informed pt she needs to get Tetanus shot (T DAP) at PCP, [facility] or return to ED.

## 2013-02-21 NOTE — ED Notes (Signed)
FM asked by PA who saw pt to call and have her come back for Tetanus update.  LVM requesting callback.

## 2013-02-21 NOTE — ED Notes (Signed)
Pt given discharge paperwork; no additional questions by pt about discharge; pt verbalized understanding of returning in 5-7 for suture removal;

## 2013-02-21 NOTE — ED Provider Notes (Signed)
Patient informed me during her visit today for her laceration repair that she was unsure of her last tetanus vaccination.  During her visit to keep that was not administered.  I have spoken with flu management 2 we'll call the patient in the morning and instructed to return for tetanus update.  Arthor Captain, PA-C 02/21/13 [phone]
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
## 2013-02-24 NOTE — ED Provider Notes (Signed)
Medical screening examination/treatment/procedure(s) were performed by non-physician practitioner and as supervising physician I was immediately available for consultation/collaboration.  Martha K Linker, MD 02/24/13 1505 

## 2013-02-27 ENCOUNTER — Emergency Department (HOSPITAL_COMMUNITY)
Admission: EM | Admit: 2013-02-27 | Discharge: 2013-02-27 | Disposition: A | Discharge status: Home / Self Care | Payer: Medicaid Other | Attending: Emergency Medicine | Admitting: Emergency Medicine

## 2013-02-27 ENCOUNTER — Encounter (HOSPITAL_COMMUNITY): Payer: Self-pay | Admitting: *Deleted

## 2013-02-27 DIAGNOSIS — Z4802 Encounter for removal of sutures: Secondary | ICD-10-CM | POA: Insufficient documentation

## 2013-02-27 DIAGNOSIS — F17200 Nicotine dependence, unspecified, uncomplicated: Secondary | ICD-10-CM | POA: Insufficient documentation

## 2013-02-27 DIAGNOSIS — F411 Generalized anxiety disorder: Secondary | ICD-10-CM | POA: Insufficient documentation

## 2013-02-27 DIAGNOSIS — Z79899 Other long term (current) drug therapy: Secondary | ICD-10-CM | POA: Insufficient documentation
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
## 2013-02-27 NOTE — ED Notes (Signed)
Sutures intact to forehead. Wound healing well.

## 2013-02-27 NOTE — ED Provider Notes (Signed)
Medical screening examination/treatment/procedure(s) were performed by non-physician practitioner and as supervising physician I was immediately available for consultation/collaboration.   David H Yao, MD 02/27/13 1550 

## 2013-07-04 ENCOUNTER — Encounter (HOSPITAL_COMMUNITY): Payer: Self-pay | Admitting: *Deleted

## 2013-07-04 ENCOUNTER — Emergency Department (HOSPITAL_COMMUNITY)
Admission: EM | Admit: 2013-07-04 | Discharge: 2013-07-04 | Disposition: A | Discharge status: Home / Self Care | Payer: Medicaid Other | Attending: Emergency Medicine | Admitting: Emergency Medicine

## 2013-07-04 DIAGNOSIS — L089 Local infection of the skin and subcutaneous tissue, unspecified: Secondary | ICD-10-CM

## 2013-07-04 DIAGNOSIS — L03211 Cellulitis of face: Secondary | ICD-10-CM | POA: Insufficient documentation

## 2013-07-04 DIAGNOSIS — F411 Generalized anxiety disorder: Secondary | ICD-10-CM | POA: Insufficient documentation

## 2013-07-04 DIAGNOSIS — F17200 Nicotine dependence, unspecified, uncomplicated: Secondary | ICD-10-CM | POA: Insufficient documentation

## 2013-07-04 DIAGNOSIS — L0201 Cutaneous abscess of face: Secondary | ICD-10-CM | POA: Insufficient documentation

## 2013-07-04 DIAGNOSIS — R51 Headache: Secondary | ICD-10-CM | POA: Insufficient documentation

## 2013-07-04 DIAGNOSIS — Z79899 Other long term (current) drug therapy: Secondary | ICD-10-CM | POA: Insufficient documentation

## 2013-07-04 MED ORDER — SULFAMETHOXAZOLE-TRIMETHOPRIM 800-160 MG PO TABS: 1.0000 | ORAL_TABLET | Freq: Two times a day (BID) | ORAL | Status: DC

## 2013-07-04 MED ORDER — ACETAMINOPHEN 500 MG PO TABS: 500.0000 mg | ORAL_TABLET | Freq: Four times a day (QID) | ORAL | Status: DC | PRN

## 2013-07-04 NOTE — ED Notes (Signed)
Pt is here with bilateral eyelid swelling and worse to left eye than right.  Pt reports frontal and top of head pain.  Pt reports symptoms for 2 days.  No visual change and pupils round and reactive

## 2013-07-04 NOTE — ED Notes (Signed)
Bilateral eyelid swelling.

## 2013-07-04 NOTE — ED Provider Notes (Signed)
History    CSN: [id number] Arrival date & time 07/04/13  0846  First MD Initiated Contact with Patient 07/04/13 [phone]     Chief Complaint  Patient presents with  . swollen eyelids   . Headache   (Consider location/radiation/quality/duration/timing/severity/associated sxs/prior Treatment) HPI Comments: Patient is a 37 year old female who presents with a 2 day history of swollen eyelids and a frontal headache. Symptoms started gradually and progressively worsened since the onset. The headache is aching and severe without radiation. No aggravating/alleviating factors. Patient reports getting her eyebrows pierced 5 days ago. She has not tried anything for symptoms. No other associated symptoms.   Past Medical History  Diagnosis Date  . Social anxiety disorder    Past Surgical History  Procedure Laterality Date  . Tooth extraction    . Cesarean section    . Tonsillectomy     No family history on file. History  Substance Use Topics  . Smoking status: Current Every Day Smoker -- 0.50 packs/day    Types: Cigarettes  . Smokeless tobacco: Not on file  . Alcohol Use: Yes     Comment: occa   OB History   Grav Para Term Preterm Abortions TAB SAB Ect Mult Living                 Review of Systems  HENT: Positive for facial swelling.   Eyes: Positive for pain.  All other systems reviewed and are negative.    Allergies  Orange juice  Home Medications   Current Outpatient Rx  Name  Route  Sig  Dispense  Refill  . carbamazepine (TEGRETOL) 200 MG tablet   Oral   Take 400 mg by mouth at bedtime.         . diazepam (VALIUM) 5 MG tablet   Oral   Take 5 mg by mouth every 8 (eight) hours as needed for anxiety.         Marland Kitchen escitalopram (LEXAPRO) 10 MG tablet   Oral   Take 10 mg by mouth daily.         . traZODone (DESYREL) 150 MG tablet   Oral   Take 300 mg by mouth at bedtime.          BP 153/98  Pulse 78  Temp(Src) 98.5 F (36.9 C) (Oral)  Resp 18  SpO2  98% Physical Exam  Nursing note and vitals reviewed. Constitutional: She is oriented to person, place, and time. She appears well-developed and well-nourished. No distress.  HENT:  Head: Normocephalic and atraumatic.  Eyes: Conjunctivae and EOM are normal. Pupils are equal, round, and reactive to light.  Bilateral upper eyelid swelling and tenderness to palpation. Bilateral lateral eyebrow piercings with surrounding edema and erythema and tenderness to palpation.   Neck: Normal range of motion.  Cardiovascular: Normal rate and regular rhythm.  Exam reveals no gallop and no friction rub.   No murmur heard. Pulmonary/Chest: Effort normal and breath sounds normal. She has no wheezes. She has no rales. She exhibits no tenderness.  Abdominal: Soft. There is no tenderness.  Musculoskeletal: Normal range of motion.  Neurological: She is alert and oriented to person, place, and time. Coordination normal.  Speech is goal-oriented. Moves limbs without ataxia.   Skin: Skin is warm and dry.  Psychiatric: She has a normal mood and affect. Her behavior is normal.    ED Course  Procedures (including critical care time) Labs Reviewed - No data to display No results found.  1.  Skin infection     MDM  9:30 AM Patient likely has skin infection from new bilateral eyebrow piercing. I will treat the patient with Bactrim and tylenol. Patient instructed to return with worsening or concerning symptoms. Vitals stable and patient afebrile.   Emilia Beck, [place] 07/05/13 [phone]

## 2013-07-06 NOTE — ED Provider Notes (Signed)
Medical screening examination/treatment/procedure(s) were performed by non-physician practitioner and as supervising physician I was immediately available for consultation/collaboration.   Carleene Cooper III, MD 07/06/13 [phone]

## 2013-10-16 ENCOUNTER — Encounter (HOSPITAL_COMMUNITY): Payer: Self-pay | Admitting: Emergency Medicine

## 2013-10-16 ENCOUNTER — Emergency Department (HOSPITAL_COMMUNITY)
Admission: EM | Admit: 2013-10-16 | Discharge: 2013-10-16 | Disposition: A | Discharge status: Home / Self Care | Payer: Medicaid Other | Attending: Emergency Medicine | Admitting: Emergency Medicine

## 2013-10-16 DIAGNOSIS — F319 Bipolar disorder, unspecified: Secondary | ICD-10-CM | POA: Insufficient documentation

## 2013-10-16 DIAGNOSIS — W57XXXA Bitten or stung by nonvenomous insect and other nonvenomous arthropods, initial encounter: Secondary | ICD-10-CM

## 2013-10-16 DIAGNOSIS — Z79899 Other long term (current) drug therapy: Secondary | ICD-10-CM | POA: Insufficient documentation

## 2013-10-16 DIAGNOSIS — IMO0001 Reserved for inherently not codable concepts without codable children: Secondary | ICD-10-CM | POA: Insufficient documentation

## 2013-10-16 DIAGNOSIS — M25519 Pain in unspecified shoulder: Secondary | ICD-10-CM | POA: Insufficient documentation

## 2013-10-16 DIAGNOSIS — Y929 Unspecified place or not applicable: Secondary | ICD-10-CM | POA: Insufficient documentation

## 2013-10-16 DIAGNOSIS — F17200 Nicotine dependence, unspecified, uncomplicated: Secondary | ICD-10-CM | POA: Insufficient documentation

## 2013-10-16 DIAGNOSIS — F4310 Post-traumatic stress disorder, unspecified: Secondary | ICD-10-CM | POA: Insufficient documentation

## 2013-10-16 DIAGNOSIS — Y939 Activity, unspecified: Secondary | ICD-10-CM | POA: Insufficient documentation

## 2013-10-16 DIAGNOSIS — F4010 Social phobia, unspecified: Secondary | ICD-10-CM | POA: Insufficient documentation

## 2013-10-16 HISTORY — DX: Bipolar disorder, unspecified: F31.9

## 2013-10-16 HISTORY — DX: Post-traumatic stress disorder, unspecified: F43.10

## 2013-10-16 MED ORDER — TRAMADOL HCL 50 MG PO TABS
50.0000 mg | ORAL_TABLET | Freq: Once | ORAL | Status: AC
  Administered 2013-10-16: 50 mg via ORAL
  Filled 2013-10-16: qty 1

## 2013-10-16 MED ORDER — TRAMADOL HCL 50 MG PO TABS: 50.0000 mg | ORAL_TABLET | Freq: Four times a day (QID) | ORAL | Status: DC | PRN

## 2013-10-16 NOTE — ED Notes (Signed)
Pt had insect "sting" to left wrist yesterday. Has slight swelling left wrist. Today, woke with "soreness" to neck and right shoulder. Denies SOB. Denies cough or sore throat. Pt is in no distress.

## 2013-10-16 NOTE — ED Provider Notes (Signed)
CSN: [id number]     Arrival date & time 10/16/13  0818 History   First MD Initiated Contact with Patient 10/16/13 [phone]     Chief Complaint  Patient presents with  . Insect Bite   (Consider location/radiation/quality/duration/timing/severity/associated sxs/prior Treatment) HPI  Grace Carson is a 37 y.o. female complaining of insect bite to to left wrist yesterday and soreness to right shoulder upon waking today. Pt patient denies fever, redness or streaking of the insect bite, mildly tender to palpation. Trauma to the shoulder  Past Medical History  Diagnosis Date  . Social anxiety disorder   . Bipolar 1 disorder   . Post traumatic stress disorder (PTSD)    Past Surgical History  Procedure Laterality Date  . Tooth extraction    . Cesarean section    . Tonsillectomy     No family history on file. History  Substance Use Topics  . Smoking status: Current Every Day Smoker -- 0.50 packs/day    Types: Cigarettes  . Smokeless tobacco: Not on file  . Alcohol Use: No     Comment: occa   OB History   Grav Para Term Preterm Abortions TAB SAB Ect Mult Living                 Review of Systems 10 systems reviewed and found to be negative, except as noted in the HPI  Allergies  Orange juice  Home Medications   Current Outpatient Rx  Name  Route  Sig  Dispense  Refill  . carbamazepine (TEGRETOL) 200 MG tablet   Oral   Take 400 mg by mouth at bedtime.         . diazepam (VALIUM) 5 MG tablet   Oral   Take 5 mg by mouth every 8 (eight) hours as needed for anxiety.         Marland Kitchen escitalopram (LEXAPRO) 10 MG tablet   Oral   Take 10 mg by mouth daily.         . traMADol (ULTRAM) 50 MG tablet   Oral   Take 1 tablet (50 mg total) by mouth every 6 (six) hours as needed for pain.   15 tablet   0    BP 127/81  Pulse 63  Temp(Src) 97.7 F (36.5 C) (Oral)  Ht 5\' 6"  (1.676 m)  Wt 220 lb (99.791 kg)  BMI 35.53 kg/m2  SpO2 98%  LMP 10/09/2013 Physical Exam  Nursing  note and vitals reviewed. Constitutional: She is oriented to person, place, and time. She appears well-developed and well-nourished. No distress.  HENT:  Head: Normocephalic.  Mouth/Throat: Oropharynx is clear and moist.  Eyes: Conjunctivae and EOM are normal. Pupils are equal, round, and reactive to light.  Neck: Normal range of motion. Neck supple.  No midline tenderness to palpation or step-offs appreciated. Patient has full range of motion without pain.   Cardiovascular: Normal rate.   Pulmonary/Chest: Effort normal. No stridor.  Musculoskeletal: Normal range of motion.  Full range of motion to right shoulder able to abduct greater than 90. Drop arm is negative, there is no tenderness to palpation along rotator cuff musculature.  Neurological: She is alert and oriented to person, place, and time.  Skin:  Pinpoint puncture wound to left wrist with no erythema, streaking, swelling, discharge.  Psychiatric: She has a normal mood and affect.    ED Course  Procedures (including critical care time) Labs Review Labs Reviewed - No data to display Imaging Review No results found.  EKG Interpretation   None       MDM   1. Insect bite    Filed Vitals:   10/16/13 0829  BP: 127/81  Pulse: 63  Temp: 97.7 F (36.5 C)  TempSrc: Oral  Height: 5\' 6"  (1.676 m)  Weight: 220 lb (99.791 kg)  SpO2: 98%     Grace Carson is a 37 y.o. female with insect bite to left breast and she woke this morning with right shoulder stiffness. Physical exam shows no abnormalities. Reassured the patient and discussed return precautions.  Medications  traMADol (ULTRAM) tablet 50 mg (50 mg Oral Given 10/16/13 0919)    Pt is hemodynamically stable, appropriate for, and amenable to discharge at this time. Pt verbalized understanding and agrees with care plan. All questions answered. Outpatient follow-up and specific return precautions discussed.    Discharge Medication List as of 10/16/2013  9:13  AM      Note: Portions of this report may have been transcribed using voice recognition software. Every effort was made to ensure accuracy; however, inadvertent computerized transcription errors may be present      Wynetta Emery, PA-C 10/17/13 1043

## 2013-10-21 NOTE — ED Provider Notes (Signed)
Medical screening examination/treatment/procedure(s) were performed by non-physician practitioner and as supervising physician I was immediately available for consultation/collaboration.  EKG Interpretation   None         Gwyneth Sprout, MD 10/21/13 [phone]

## 2013-11-28 ENCOUNTER — Emergency Department (HOSPITAL_COMMUNITY)
Admission: EM | Admit: 2013-11-28 | Discharge: 2013-11-28 | Disposition: A | Discharge status: Home / Self Care | Payer: Medicaid Other | Attending: Emergency Medicine | Admitting: Emergency Medicine

## 2013-11-28 ENCOUNTER — Encounter (HOSPITAL_COMMUNITY): Payer: Self-pay | Admitting: Emergency Medicine

## 2013-11-28 ENCOUNTER — Emergency Department (HOSPITAL_COMMUNITY): Payer: Medicaid Other

## 2013-11-28 DIAGNOSIS — Z79899 Other long term (current) drug therapy: Secondary | ICD-10-CM | POA: Insufficient documentation

## 2013-11-28 DIAGNOSIS — H5710 Ocular pain, unspecified eye: Secondary | ICD-10-CM | POA: Insufficient documentation

## 2013-11-28 DIAGNOSIS — M25511 Pain in right shoulder: Secondary | ICD-10-CM

## 2013-11-28 DIAGNOSIS — F319 Bipolar disorder, unspecified: Secondary | ICD-10-CM | POA: Insufficient documentation

## 2013-11-28 DIAGNOSIS — L0291 Cutaneous abscess, unspecified: Secondary | ICD-10-CM

## 2013-11-28 DIAGNOSIS — F4310 Post-traumatic stress disorder, unspecified: Secondary | ICD-10-CM | POA: Insufficient documentation

## 2013-11-28 DIAGNOSIS — F17200 Nicotine dependence, unspecified, uncomplicated: Secondary | ICD-10-CM | POA: Insufficient documentation

## 2013-11-28 DIAGNOSIS — L0201 Cutaneous abscess of face: Secondary | ICD-10-CM | POA: Insufficient documentation

## 2013-11-28 DIAGNOSIS — R6884 Jaw pain: Secondary | ICD-10-CM | POA: Insufficient documentation

## 2013-11-28 DIAGNOSIS — L03211 Cellulitis of face: Secondary | ICD-10-CM | POA: Insufficient documentation

## 2013-11-28 DIAGNOSIS — M25519 Pain in unspecified shoulder: Secondary | ICD-10-CM | POA: Insufficient documentation

## 2013-11-28 DIAGNOSIS — L0390 Cellulitis, unspecified: Secondary | ICD-10-CM

## 2013-11-28 DIAGNOSIS — F411 Generalized anxiety disorder: Secondary | ICD-10-CM | POA: Insufficient documentation

## 2013-11-28 MED ORDER — ONDANSETRON 4 MG PO TBDP
8.0000 mg | ORAL_TABLET | Freq: Once | ORAL | Status: AC
  Administered 2013-11-28: 8 mg via ORAL
  Filled 2013-11-28: qty 2

## 2013-11-28 MED ORDER — OXYCODONE-ACETAMINOPHEN 5-325 MG PO TABS: 2.0000 | ORAL_TABLET | Freq: Four times a day (QID) | ORAL | Status: DC | PRN

## 2013-11-28 MED ORDER — OXYCODONE-ACETAMINOPHEN 5-325 MG PO TABS
2.0000 | ORAL_TABLET | Freq: Once | ORAL | Status: AC
  Administered 2013-11-28: 2 via ORAL
  Filled 2013-11-28: qty 2

## 2013-11-28 MED ORDER — CLINDAMYCIN HCL 150 MG PO CAPS: 300.0000 mg | ORAL_CAPSULE | Freq: Three times a day (TID) | ORAL | Status: DC

## 2013-11-28 MED ORDER — PROMETHAZINE HCL 25 MG PO TABS: 25.0000 mg | ORAL_TABLET | Freq: Four times a day (QID) | ORAL | Status: DC | PRN

## 2013-11-28 NOTE — ED Provider Notes (Signed)
Medical screening examination/treatment/procedure(s) were performed by non-physician practitioner and as supervising physician I was immediately available for consultation/collaboration.  EKG Interpretation   None         Enid Skeens, MD 11/28/13 1701

## 2013-11-28 NOTE — ED Provider Notes (Signed)
CSN: [id number]     Arrival date & time 11/28/13  1044 History  This chart was scribed for non-physician practitioner working with Enid Skeens, MD by Ashley Jacobs, ED scribe. This patient was seen in room TR09C/TR09C and the patient's care was started at 12:42 PM.   First MD Initiated Contact with Patient 11/28/13 1158     Chief Complaint  Patient presents with  . Abscess  . Shoulder Pain   (Consider location/radiation/quality/duration/timing/severity/associated sxs/prior Treatment) The history is provided by the patient and medical records. No language interpreter was used.   HPI Comments: Grace Carson is a 37 y.o. female who presents to the Emergency Department complaining of abscess to her outer right nostril and right shoulder pain. She describes the pain as constant aching and pain in her lower jaw and right eye. Pt states when she wakes in the morning her eye is "almost swollen shut". She had a prior similar episode when she got her eyebrows pierced. She had a "bump" several weeks prior that cause facial swelling. She also complains that her right shoulder is painful after falling a month ago. The pain is sharp and radiating and worse when she moves her arm in certain positions. She denies pain in her wrist and elbow. Pt denies nausea, vomiting and fever.   She has a medical hx of social anxiety disorder, PTSD and bipolar 1 disorder. Pt smokes 0.5 pack of cigarettes a day and drinks alcohol occasionally.     Past Medical History  Diagnosis Date  . Social anxiety disorder   . Bipolar 1 disorder   . Post traumatic stress disorder (PTSD)    Past Surgical History  Procedure Laterality Date  . Tooth extraction    . Cesarean section    . Tonsillectomy     History reviewed. No pertinent family history. History  Substance Use Topics  . Smoking status: Current Every Day Smoker -- 0.50 packs/day    Types: Cigarettes  . Smokeless tobacco: Not on file  . Alcohol Use: No      Comment: occa   OB History   Grav Para Term Preterm Abortions TAB SAB Ect Mult Living                 Review of Systems  Constitutional: Negative for fever.  Gastrointestinal: Negative for nausea and vomiting.  Musculoskeletal: Positive for arthralgias and myalgias.       Right shoulder pain  Skin: Positive for wound.       abscess to the right side of nose  All other systems reviewed and are negative.    Allergies  Orange juice  Home Medications   Current Outpatient Rx  Name  Route  Sig  Dispense  Refill  . carbamazepine (TEGRETOL) 200 MG tablet   Oral   Take 400 mg by mouth at bedtime.         . diazepam (VALIUM) 5 MG tablet   Oral   Take 5-10 mg by mouth 3 (three) times daily as needed for anxiety (Can take up to 1 tablet every morning, 2 tablets every day at noon, and 1 tablet every night at bedtime.).          Marland Kitchen escitalopram (LEXAPRO) 20 MG tablet   Oral   Take 20 mg by mouth daily.          BP 141/86  Pulse 69  Temp(Src) 97.8 F (36.6 C) (Oral)  Resp 20  Wt 230 lb (104.327 kg)  SpO2  100% Physical Exam  Nursing note and vitals reviewed. Constitutional: She is oriented to person, place, and time. She appears well-developed and well-nourished. No distress.  HENT:  Head: Normocephalic and atraumatic.  Right Ear: External ear normal.  Left Ear: External ear normal.  Nose: Nose normal.  Mouth/Throat: Oropharynx is clear and moist.  Eyes: Conjunctivae and EOM are normal. Pupils are equal, round, and reactive to light. Right conjunctiva is not injected. Left conjunctiva is not injected.  0.5 cm area of induration and erythema to the lateral aspect of the right nose just above eyelid. No involvement with eye. Mild swelling noted around right eye. No erythema. No pain with EOMs.   Neck: Normal range of motion.  Cardiovascular: Normal rate, regular rhythm, normal heart sounds, intact distal pulses and normal pulses.   Pulmonary/Chest: Effort normal and  breath sounds normal. No stridor. No respiratory distress. She has no wheezes. She has no rales.  Abdominal: Soft. She exhibits no distension.  Musculoskeletal: Normal range of motion.  Tender to palpation over posterior aspect of the right shoulder. Abduction limited due to pain.  Neurovascularly intact. Compartment soft.   Neurological: She is alert and oriented to person, place, and time. She has normal strength.  Skin: Skin is warm and dry. She is not diaphoretic. There is erythema.  Psychiatric: She has a normal mood and affect. Her behavior is normal.    ED Course  Procedures (including critical care time) DIAGNOSTIC STUDIES: Oxygen Saturation is 100% on room air, normal by my interpretation.    COORDINATION OF CARE: 12:45 PM Discussed course of care with pt which includes right shoulder x-ray, percocet 5-325 mg and Zofran ODT 8mg . Pt understands and agrees.  Labs Review Labs Reviewed - No data to display Imaging Review Dg Shoulder Right  11/28/2013   CLINICAL DATA:  Severe right shoulder pain.  EXAM: RIGHT SHOULDER - 2+ VIEW  COMPARISON:  None.  FINDINGS: No acute fracture or dislocation is identified. Proliferative changes are seen involving the [facility] joint. No bony lesions or destruction identified. Soft tissues are unremarkable.  IMPRESSION: Degenerative changes of the [facility] joint.   Electronically Signed   By: Irish Lack M.D.   On: 11/28/2013 14:32   INCISION AND DRAINAGE Performed by: Junious Silk Consent: Verbal consent obtained. Risks and benefits: risks, benefits and alternatives were discussed Type: abscess  Body area: right lateral outer nose  Anesthesia: local infiltration  Incision was made with a scalpel.  Local anesthetic: lidocaine 1%   Anesthetic total: 2 ml  Complexity: complex Blunt dissection to break up loculations  Drainage: purulent  Drainage amount: moderate  Patient tolerance: Patient tolerated the procedure well with no immediate  complications.     EKG Interpretation   None       MDM   1. Cellulitis   2. Shoulder pain, acute, right   3. Abscess    Patient with skin abscess amenable to incision and drainage.  Abscess was not large enough to warrant packing or drain,  wound recheck in 2 days. Encouraged home warm soaks and flushing.  Mild signs of cellulitis is surrounding skin.  Given location of abscess and surrounding swelling, I will d/c with clindamycin for possible early preseptal cellulitis. She was given a referral for frequent abscesses to surgery. She also has shoulder pain and decreased ROM. This appears to be a chronic issue. XR shows degenerative changes of the [facility] joint. She is neurovascularly intact and the compartment is soft. Shoulder exercises were given  as well as referral to ortho. Return instructions given. Vital signs stable for discharge. Patient / Family / Caregiver informed of clinical course, understand medical decision-making process, and agree with plan.    I personally performed the services described in this documentation, which was scribed in my presence. The recorded information has been reviewed and is accurate.     Mora Bellman, PA-C 11/28/13 1547

## 2013-11-28 NOTE — ED Notes (Signed)
Pt in c/o bump to right side of nose that is causing pain to face, history of same, also c/o pain to right shoulder s/p a fall a month ago, increased pain with movement

## 2014-03-05 ENCOUNTER — Emergency Department (HOSPITAL_COMMUNITY)
Admission: EM | Admit: 2014-03-05 | Discharge: 2014-03-05 | Disposition: A | Discharge status: Home / Self Care | Payer: Medicaid Other | Attending: Emergency Medicine | Admitting: Emergency Medicine

## 2014-03-05 ENCOUNTER — Encounter (HOSPITAL_COMMUNITY): Payer: Self-pay | Admitting: Emergency Medicine

## 2014-03-05 DIAGNOSIS — H6002 Abscess of left external ear: Secondary | ICD-10-CM

## 2014-03-05 DIAGNOSIS — F319 Bipolar disorder, unspecified: Secondary | ICD-10-CM | POA: Insufficient documentation

## 2014-03-05 DIAGNOSIS — F17200 Nicotine dependence, unspecified, uncomplicated: Secondary | ICD-10-CM | POA: Insufficient documentation

## 2014-03-05 DIAGNOSIS — F4010 Social phobia, unspecified: Secondary | ICD-10-CM | POA: Insufficient documentation

## 2014-03-05 DIAGNOSIS — Z792 Long term (current) use of antibiotics: Secondary | ICD-10-CM | POA: Insufficient documentation

## 2014-03-05 DIAGNOSIS — F4310 Post-traumatic stress disorder, unspecified: Secondary | ICD-10-CM | POA: Insufficient documentation

## 2014-03-05 DIAGNOSIS — Z79899 Other long term (current) drug therapy: Secondary | ICD-10-CM | POA: Insufficient documentation

## 2014-03-05 DIAGNOSIS — H60399 Other infective otitis externa, unspecified ear: Secondary | ICD-10-CM | POA: Insufficient documentation

## 2014-03-05 MED ORDER — DIAZEPAM 5 MG PO TABS
10.0000 mg | ORAL_TABLET | Freq: Once | ORAL | Status: AC
  Administered 2014-03-05: 10 mg via ORAL
  Filled 2014-03-05: qty 2

## 2014-03-05 MED ORDER — HYDROCODONE-ACETAMINOPHEN 5-325 MG PO TABS: 1.0000 | ORAL_TABLET | ORAL | Status: DC | PRN

## 2014-03-05 NOTE — Discharge Instructions (Signed)
It is important to not put anything in your urine it is smaller than your elbow. Apply warm compresses intermittently throughout the day. Take Vicodin for severe pain only. No driving or operating heavy machinery while taking vicodin. This medication may cause drowsiness.  Abscess An abscess is an infected area that contains a collection of pus and debris.It can occur in almost any part of the body. An abscess is also known as a furuncle or boil. CAUSES  An abscess occurs when tissue gets infected. This can occur from blockage of oil or sweat glands, infection of hair follicles, or a minor injury to the skin. As the body tries to fight the infection, pus collects in the area and creates pressure under the skin. This pressure causes pain. People with weakened immune systems have difficulty fighting infections and get certain abscesses more often.  SYMPTOMS Usually an abscess develops on the skin and becomes a painful mass that is red, warm, and tender. If the abscess forms under the skin, you may feel a moveable soft area under the skin. Some abscesses break open (rupture) on their own, but most will continue to get worse without care. The infection can spread deeper into the body and eventually into the bloodstream, causing you to feel ill.  DIAGNOSIS  Your caregiver will take your medical history and perform a physical exam. A sample of fluid may also be taken from the abscess to determine what is causing your infection. TREATMENT  Your caregiver may prescribe antibiotic medicines to fight the infection. However, taking antibiotics alone usually does not cure an abscess. Your caregiver may need to make a small cut (incision) in the abscess to drain the pus. In some cases, gauze is packed into the abscess to reduce pain and to continue draining the area. HOME CARE INSTRUCTIONS   Only take over-the-counter or prescription medicines for pain, discomfort, or fever as directed by your caregiver.  If you  were prescribed antibiotics, take them as directed. Finish them even if you start to feel better.  If gauze is used, follow your caregiver's directions for changing the gauze.  To avoid spreading the infection:  Keep your draining abscess covered with a bandage.  Wash your hands well.  Do not share personal care items, towels, or whirlpools with others.  Avoid skin contact with others.  Keep your skin and clothes clean around the abscess.  Keep all follow-up appointments as directed by your caregiver. SEEK MEDICAL CARE IF:   You have increased pain, swelling, redness, fluid drainage, or bleeding.  You have muscle aches, chills, or a general ill feeling.  You have a fever. MAKE SURE YOU:   Understand these instructions.  Will watch your condition.  Will get help right away if you are not doing well or get worse. Document Released: 09/23/2005 Document Revised: 06/14/2012 Document Reviewed: 02/26/2012 [facility] Patient Information 2014 [place], [place].

## 2014-03-05 NOTE — ED Notes (Signed)
Per pt sts left ear pain and swelling x 3 days.

## 2014-03-05 NOTE — ED Provider Notes (Addendum)
Medical screening examination/treatment/procedure(s) were conducted as a shared visit with non-physician practitioner(s) and myself.  I personally evaluated the patient during the encounter.  Pt with facial abscess affecting ear canal with localized tenderness, edema closing off partial ear canal.  Hearing intact.  TM not able to be visualized. Pt is not febrile nor toxic appearing.   Will need I&D and ENT referral for follow up.       Gavin PoundMichael Y. Oletta LamasGhim, MD 03/06/14 1346  Gavin PoundMichael Y. Siana Panameno, MD 03/06/14 1347

## 2014-03-05 NOTE — ED Provider Notes (Signed)
CSN: [id number]     Arrival date & time 03/05/14  1505 History  This chart was scribed for non-physician practitioner, Johnnette Gourdobyn Albert, PA-C working with Gavin PoundMichael Y. Oletta LamasGhim, MD by Greggory StallionKayla Andersen, ED scribe. This patient was seen in room TR07C/TR07C and the patient's care was started at 3:41 PM.     Chief Complaint  Patient presents with  . Otalgia   The history is provided by the patient. No language interpreter was used.   HPI Comments: Grace Carson is a 38 y.o. female who presents to the Emergency Department complaining of an abscess to the outside of her left ear that started one week ago. She states it has gotten worse and moved to the inside of her ear, causing worsening ear pain that radiates into her jaw. Rates the pain 9/10. Pt states touch worsens the pain.   Past Medical History  Diagnosis Date  . Social anxiety disorder   . Bipolar 1 disorder   . Post traumatic stress disorder (PTSD)    Past Surgical History  Procedure Laterality Date  . Tooth extraction    . Cesarean section    . Tonsillectomy     History reviewed. No pertinent family history. History  Substance Use Topics  . Smoking status: Current Every Day Smoker -- 0.50 packs/day    Types: Cigarettes  . Smokeless tobacco: Not on file  . Alcohol Use: No     Comment: occa   OB History   Grav Para Term Preterm Abortions TAB SAB Ect Mult Living                 Review of Systems  HENT: Positive for ear pain.   All other systems reviewed and are negative.   Allergies  Orange juice  Home Medications   Current Outpatient Rx  Name  Route  Sig  Dispense  Refill  . carbamazepine (TEGRETOL) 200 MG tablet   Oral   Take 400 mg by mouth at bedtime.         . clindamycin (CLEOCIN) 150 MG capsule   Oral   Take 2 capsules (300 mg total) by mouth 3 (three) times daily. May dispense as 150mg  capsules   60 capsule   0   . diazepam (VALIUM) 5 MG tablet   Oral   Take 5-10 mg by mouth 3 (three) times daily as needed  for anxiety (Can take up to 1 tablet every morning, 2 tablets every day at noon, and 1 tablet every night at bedtime.).          Marland Kitchen. escitalopram (LEXAPRO) 20 MG tablet   Oral   Take 20 mg by mouth daily.         Marland Kitchen. HYDROcodone-acetaminophen (NORCO/VICODIN) 5-325 MG per tablet   Oral   Take 1-2 tablets by mouth every 4 (four) hours as needed.   10 tablet   0   . oxyCODONE-acetaminophen (PERCOCET/ROXICET) 5-325 MG per tablet   Oral   Take 2 tablets by mouth every 6 (six) hours as needed for severe pain.   12 tablet   0   . promethazine (PHENERGAN) 25 MG tablet   Oral   Take 1 tablet (25 mg total) by mouth every 6 (six) hours as needed for nausea or vomiting.   12 tablet   0    BP 132/75  Pulse 74  Temp(Src) 97.3 F (36.3 C)  Resp 18  SpO2 100%  LMP 03/04/2014  Physical Exam  Nursing note and vitals reviewed.  Constitutional: She is oriented to person, place, and time. She appears well-developed and well-nourished. No distress.  HENT:  Head: Normocephalic and atraumatic.  Mouth/Throat: Oropharynx is clear and moist.  1.5 cm area of induration outside of the tragus into the outer ear canal. Very tender. No drainage.   Eyes: Conjunctivae and EOM are normal.  Neck: Normal range of motion. Neck supple.  Cardiovascular: Normal rate, regular rhythm and normal heart sounds.   Pulmonary/Chest: Effort normal and breath sounds normal. No respiratory distress.  Musculoskeletal: Normal range of motion. She exhibits no edema.  Neurological: She is alert and oriented to person, place, and time. No sensory deficit.  Skin: Skin is warm and dry.  Psychiatric: She has a normal mood and affect. Her behavior is normal.    ED Course  Procedures (including critical care time) INCISION AND DRAINAGE Performed by: Johnnette Gourd Consent: Verbal consent obtained. Risks and benefits: risks, benefits and alternatives were discussed Type: abscess  Body area: left ear  Anesthesia:  none  Puncture with 18g needle  Complexity: simple  Drainage: purulent  Drainage amount: medium  Packing material: none  Patient tolerance: Patient tolerated the procedure well with no immediate complications.    DIAGNOSTIC STUDIES: Oxygen Saturation is 100% on RA, normal by my interpretation.    COORDINATION OF CARE: 3:43 PM-Discussed treatment plan which includes speaking with Dr. Oletta Lamas with pt at bedside and pt agreed to plan.   Labs Review Labs Reviewed - No data to display Imaging Review No results found.   EKG Interpretation None      MDM   Final diagnoses:  Abscess of external ear, left   Patient presents with abscess to left ear. Admits to sticking pen caps, Q-tips and other objects into her ear, probably irritating it causing this abscess. Abscess punctured, purulent drainage expressed. Followup with ENT. No fever, stable vital signs. Stable for discharge. Return precautions given. Patient states understanding of treatment care plan and is agreeable.   Case discussed with attending Dr. Oletta Lamas who also evaluated patient and agrees with plan of care.  I personally performed the services described in this documentation, which was scribed in my presence. The recorded information has been reviewed and is accurate.   Trevor Mace, PA-C 03/05/14 1624

## 2014-06-11 ENCOUNTER — Emergency Department (HOSPITAL_COMMUNITY)
Admission: EM | Admit: 2014-06-11 | Discharge: 2014-06-11 | Disposition: A | Discharge status: Home / Self Care | Payer: Medicaid Other | Attending: Emergency Medicine | Admitting: Emergency Medicine

## 2014-06-11 ENCOUNTER — Encounter (HOSPITAL_COMMUNITY): Payer: Self-pay | Admitting: Emergency Medicine

## 2014-06-11 DIAGNOSIS — H6000 Abscess of external ear, unspecified ear: Secondary | ICD-10-CM

## 2014-06-11 DIAGNOSIS — F4310 Post-traumatic stress disorder, unspecified: Secondary | ICD-10-CM | POA: Insufficient documentation

## 2014-06-11 DIAGNOSIS — H60399 Other infective otitis externa, unspecified ear: Secondary | ICD-10-CM | POA: Insufficient documentation

## 2014-06-11 DIAGNOSIS — F319 Bipolar disorder, unspecified: Secondary | ICD-10-CM | POA: Insufficient documentation

## 2014-06-11 DIAGNOSIS — F17200 Nicotine dependence, unspecified, uncomplicated: Secondary | ICD-10-CM | POA: Insufficient documentation

## 2014-06-11 DIAGNOSIS — Z792 Long term (current) use of antibiotics: Secondary | ICD-10-CM | POA: Insufficient documentation

## 2014-06-11 DIAGNOSIS — R51 Headache: Secondary | ICD-10-CM | POA: Insufficient documentation

## 2014-06-11 DIAGNOSIS — Z79899 Other long term (current) drug therapy: Secondary | ICD-10-CM | POA: Insufficient documentation

## 2014-06-11 DIAGNOSIS — H539 Unspecified visual disturbance: Secondary | ICD-10-CM | POA: Insufficient documentation

## 2014-06-11 DIAGNOSIS — F4010 Social phobia, unspecified: Secondary | ICD-10-CM | POA: Insufficient documentation

## 2014-06-11 HISTORY — DX: Major depressive disorder, single episode, unspecified: F32.9

## 2014-06-11 HISTORY — DX: Depression, unspecified: F32.A

## 2014-06-11 MED ORDER — HYDROCODONE-ACETAMINOPHEN 5-325 MG PO TABS
2.0000 | ORAL_TABLET | Freq: Once | ORAL | Status: AC
  Administered 2014-06-11: 2 via ORAL
  Filled 2014-06-11: qty 2

## 2014-06-11 MED ORDER — NEOMYCIN-POLYMYXIN-HC 1 % OT SOLN
3.0000 [drp] | Freq: Four times a day (QID) | OTIC | Status: DC
  Administered 2014-06-11: 3 [drp] via OTIC
  Filled 2014-06-11: qty 10

## 2014-06-11 MED ORDER — CEPHALEXIN 500 MG PO CAPS: 500.0000 mg | ORAL_CAPSULE | Freq: Four times a day (QID) | ORAL | Status: DC

## 2014-06-11 NOTE — ED Provider Notes (Signed)
CSN: [id number]     Arrival date & time 06/11/14  1334 History  This chart was scribed for non-physician practitioner Teressa LowerVrinda Caleb Prigmore, NP working with Gerhard Munchobert Lockwood, MD by Joaquin MusicKristina Sanchez-Matthews, ED Scribe. This patient was seen in room TR08C/TR08C and the patient's care was started at 1:50 PM .   Chief Complaint  Patient presents with  . Otalgia   The history is provided by the patient. No language interpreter was used.   HPI Comments: Grace Carson is a 38 y.o. female who presents to the Emergency Department complaining of ongoing L otalgia and HA that began March 2015 and has not improved. Pt states she was seen March 205 with L otalgia and had an abscess; had abscess lanced and reports having pain since then. Denies F/U with ENT and fever.  Past Medical History  Diagnosis Date  . Social anxiety disorder   . Bipolar 1 disorder   . Post traumatic stress disorder (PTSD)    Past Surgical History  Procedure Laterality Date  . Tooth extraction    . Cesarean section    . Tonsillectomy     No family history on file. History  Substance Use Topics  . Smoking status: Current Every Day Smoker -- 0.50 packs/day    Types: Cigarettes  . Smokeless tobacco: Not on file  . Alcohol Use: No     Comment: occa   OB History   Grav Para Term Preterm Abortions TAB SAB Ect Mult Living                 Review of Systems  Constitutional: Negative for fever.  HENT: Positive for ear pain. Negative for ear discharge and facial swelling.   Eyes: Positive for visual disturbance.  Neurological: Positive for headaches.  All other systems reviewed and are negative.  Allergies  Orange juice  Home Medications   Prior to Admission medications   Medication Sig Start Date End Date Taking? Authorizing Provider  carbamazepine (TEGRETOL) 200 MG tablet Take 400 mg by mouth at bedtime.    Historical Provider, MD  clindamycin (CLEOCIN) 150 MG capsule Take 2 capsules (300 mg total) by mouth 3 (three)  times daily. May dispense as 150mg  capsules 11/28/13   Mora BellmanHannah S Merrell, PA-C  diazepam (VALIUM) 5 MG tablet Take 5-10 mg by mouth 3 (three) times daily as needed for anxiety (Can take up to 1 tablet every morning, 2 tablets every day at noon, and 1 tablet every night at bedtime.).     Historical Provider, MD  escitalopram (LEXAPRO) 20 MG tablet Take 20 mg by mouth daily.    Historical Provider, MD  HYDROcodone-acetaminophen (NORCO/VICODIN) 5-325 MG per tablet Take 1-2 tablets by mouth every 4 (four) hours as needed. 03/05/14   Trevor Maceobyn M Albert, PA-C  oxyCODONE-acetaminophen (PERCOCET/ROXICET) 5-325 MG per tablet Take 2 tablets by mouth every 6 (six) hours as needed for severe pain. 11/28/13   Mora BellmanHannah S Merrell, PA-C  promethazine (PHENERGAN) 25 MG tablet Take 1 tablet (25 mg total) by mouth every 6 (six) hours as needed for nausea or vomiting. 11/28/13   Ramon DredgeHannah S Merrell, PA-C   BP 150/92  Pulse 72  Temp(Src) 98.9 F (37.2 C) (Oral)  Resp 18  Wt 244 lb (110.678 kg)  SpO2 100%  Physical Exam  Nursing note and vitals reviewed. Constitutional: She is oriented to person, place, and time. She appears well-developed and well-nourished. No distress.  HENT:  Head: Normocephalic and atraumatic.  Fluctuant area to the L canal  Eyes: EOM are normal.  Neck: Neck supple. No tracheal deviation present.  Cardiovascular: Normal rate.   Pulmonary/Chest: Effort normal. No respiratory distress.  Musculoskeletal: Normal range of motion.  Neurological: She is alert and oriented to person, place, and time.  Skin: Skin is warm and dry.  Psychiatric: She has a normal mood and affect. Her behavior is normal.    ED Course  Procedures  DIAGNOSTIC STUDIES: Oxygen Saturation is 100% on RA, normal by my interpretation.    COORDINATION OF CARE: 1:53 PM-Discussed treatment plan which includes incision and drainage. Will discharge pt with abx. Pt agreed to plan.   2:45 PM- INCISION AND DRAINAGE Performed by: Teressa LowerVrinda  Dalene Robards, NP Consent: Verbal consent obtained. Risks and benefits: risks, benefits and alternatives were discussed  Sterile Prep and Drape  Type: abscess  Body area: L ear  Incision: 11 Blade  Complexity: complex Blunt dissection to breakup loculations  Drainage amount: moderate   Flushed with copious amount of sterile saline  Patient tolerance: Patient tolerated the procedure well with no immediate complications.  2:54 PM-Placed earwic and Hydrocortisone drops. Urged pt to F/U with ENT and will provide a referral. Will discharge pt with antibiotics. Will administer pain medication while in the ED. Pt agreed to plan.  Labs Review Labs Reviewed - No data to display  Imaging Review No results found.   EKG Interpretation None     MDM   Final diagnoses:  Abscess, ear canal    Abscess I&D with drainage. Ear wick place with drops. Discussed the need for follow up with ent as this is a recurrent injury  I personally performed the services described in this documentation, which was scribed in my presence. The recorded information has been reviewed and is accurate.    Teressa LowerVrinda Marriana Hibberd, NP 06/11/14 1655

## 2014-06-11 NOTE — ED Notes (Signed)
Pt states she was here in March with left ear pain and had an abscess. States it really has not been better since they lanced it on that visit. Ear has visible abscess in the canal. Pt states the pain radiates from her ear down the side of her face.

## 2014-06-11 NOTE — ED Notes (Signed)
Pt given ear drops to take home

## 2014-06-11 NOTE — Discharge Instructions (Signed)
Abscess An abscess is an infected area that contains a collection of pus and debris.It can occur in almost any part of the body. An abscess is also known as a furuncle or boil. CAUSES  An abscess occurs when tissue gets infected. This can occur from blockage of oil or sweat glands, infection of hair follicles, or a minor injury to the skin. As the body tries to fight the infection, pus collects in the area and creates pressure under the skin. This pressure causes pain. People with weakened immune systems have difficulty fighting infections and get certain abscesses more often.  SYMPTOMS Usually an abscess develops on the skin and becomes a painful mass that is red, warm, and tender. If the abscess forms under the skin, you may feel a moveable soft area under the skin. Some abscesses break open (rupture) on their own, but most will continue to get worse without care. The infection can spread deeper into the body and eventually into the bloodstream, causing you to feel ill.  DIAGNOSIS  Your caregiver will take your medical history and perform a physical exam. A sample of fluid may also be taken from the abscess to determine what is causing your infection. TREATMENT  Your caregiver may prescribe antibiotic medicines to fight the infection. However, taking antibiotics alone usually does not cure an abscess. Your caregiver may need to make a small cut (incision) in the abscess to drain the pus. In some cases, gauze is packed into the abscess to reduce pain and to continue draining the area. HOME CARE INSTRUCTIONS   Only take over-the-counter or prescription medicines for pain, discomfort, or fever as directed by your caregiver.  If you were prescribed antibiotics, take them as directed. Finish them even if you start to feel better.  If gauze is used, follow your caregiver's directions for changing the gauze.  To avoid spreading the infection:  Keep your draining abscess covered with a  bandage.  Wash your hands well.  Do not share personal care items, towels, or whirlpools with others.  Avoid skin contact with others.  Keep your skin and clothes clean around the abscess.  Keep all follow-up appointments as directed by your caregiver. SEEK MEDICAL CARE IF:   You have increased pain, swelling, redness, fluid drainage, or bleeding.  You have muscle aches, chills, or a general ill feeling.  You have a fever. MAKE SURE YOU:   Understand these instructions.  Will watch your condition.  Will get help right away if you are not doing well or get worse. Document Released: 09/23/2005 Document Revised: 06/14/2012 Document Reviewed: 02/26/2012 [employer] Patient Information 2014 [employer].  

## 2014-06-12 NOTE — ED Provider Notes (Signed)
  Medical screening examination/treatment/procedure(s) were performed by non-physician practitioner and as supervising physician I was immediately available for consultation/collaboration.   EKG Interpretation None         Ismerai Bin, MD 06/12/14 0938 

## 2016-04-05 ENCOUNTER — Encounter (HOSPITAL_COMMUNITY): Payer: Self-pay | Admitting: *Deleted

## 2016-04-05 ENCOUNTER — Emergency Department (HOSPITAL_COMMUNITY)
Admission: EM | Admit: 2016-04-05 | Discharge: 2016-04-06 | Disposition: A | Discharge status: Home / Self Care | Payer: Medicaid Other | Attending: Emergency Medicine | Admitting: Emergency Medicine

## 2016-04-05 DIAGNOSIS — Z792 Long term (current) use of antibiotics: Secondary | ICD-10-CM | POA: Insufficient documentation

## 2016-04-05 DIAGNOSIS — F319 Bipolar disorder, unspecified: Secondary | ICD-10-CM | POA: Diagnosis not present

## 2016-04-05 DIAGNOSIS — F17210 Nicotine dependence, cigarettes, uncomplicated: Secondary | ICD-10-CM | POA: Insufficient documentation

## 2016-04-05 DIAGNOSIS — L0231 Cutaneous abscess of buttock: Secondary | ICD-10-CM | POA: Insufficient documentation

## 2016-04-05 DIAGNOSIS — Z79899 Other long term (current) drug therapy: Secondary | ICD-10-CM | POA: Diagnosis not present

## 2016-04-05 DIAGNOSIS — L0291 Cutaneous abscess, unspecified: Secondary | ICD-10-CM

## 2016-04-05 NOTE — ED Notes (Signed)
See EDP assessment 

## 2016-04-05 NOTE — ED Notes (Signed)
The pt is c/o an abscess  On her rt buttocks for 3 days  Hx of abscesses in the past  lmp last month

## 2016-04-06 MED ORDER — LIDOCAINE-EPINEPHRINE (PF) 2 %-1:200000 IJ SOLN
10.0000 mL | Freq: Once | INTRAMUSCULAR | Status: AC
  Administered 2016-04-06: 10 mL
  Filled 2016-04-06: qty 20

## 2016-04-06 NOTE — ED Notes (Signed)
Instructed pt to get dressed, told pt to wait for DC instructions. Pt proceeds to exit room without waiting for DC instructions and left with SO. NAD

## 2016-04-06 NOTE — ED Notes (Signed)
Lidocaine at beside for PCP

## 2016-04-06 NOTE — ED Notes (Signed)
EDP at bedside  

## 2016-04-06 NOTE — ED Provider Notes (Signed)
CSN: [id number]     Arrival date & time 04/05/16  2234 History   First MD Initiated Contact with Patient 04/05/16 2350     Chief Complaint  Patient presents with  . Abscess     (Consider location/radiation/quality/duration/timing/severity/associated sxs/prior Treatment) HPI Comments: Patient presents today with a chief complaint of an abscess to the right buttock.  She states that the abscess has been present for the past 1.5 weeks, but has been more painful and increasing in size over the past couple of days.  She states that she attempted to squeeze it, but did not have any drainage.  She has not taken any medication for pain.  She denies nausea, vomiting, fever, chills, or pain with BM.  She reports a history of Abscesses.  No history of DM or HIV.   The history is provided by the patient.    Past Medical History  Diagnosis Date  . Social anxiety disorder   . Bipolar 1 disorder (HCC)   . Post traumatic stress disorder (PTSD)   . Depression    Past Surgical History  Procedure Laterality Date  . Tooth extraction    . Cesarean section    . Tonsillectomy     No family history on file. Social History  Substance Use Topics  . Smoking status: Current Every Day Smoker -- 0.50 packs/day    Types: Cigarettes  . Smokeless tobacco: None  . Alcohol Use: Yes     Comment: occa   OB History    No data available     Review of Systems  All other systems reviewed and are negative.     Allergies  Orange juice  Home Medications   Prior to Admission medications   Medication Sig Start Date End Date Taking? Authorizing Provider  ALPRAZolam Prudy Feeler) 1 MG tablet Take 0.5-1 mg by mouth 3 (three) times daily. Take 1 tablet ( ) in the am,noon and half tablet (0.5) in the evening    Historical Provider, MD  carbamazepine (TEGRETOL) 200 MG tablet Take 400 mg by mouth at bedtime.    Historical Provider, MD  cephALEXin (KEFLEX) 500 MG capsule Take 1 capsule (500 mg total) by mouth 4 (four)  times daily. 06/11/14   Teressa Lower, NP  escitalopram (LEXAPRO) 20 MG tablet Take 20 mg by mouth daily.    Historical Provider, MD  lurasidone (LATUDA) 40 MG TABS tablet Take 20 mg by mouth daily with breakfast.    Historical Provider, MD   BP 120/72 mmHg  Pulse 85  Temp(Src) 98.3 F (36.8 C) (Oral)  Resp 20  Ht  (1.651 m)  Wt 107.049 kg  BMI 39.27 kg/m2  SpO2 96% Physical Exam  Constitutional: She appears well-developed and well-nourished.  HENT:  Head: Normocephalic and atraumatic.  Neck: Normal range of motion. Neck supple.  Cardiovascular: Normal rate, regular rhythm and normal heart sounds.   Pulmonary/Chest: Effort normal and breath sounds normal.  Musculoskeletal: Normal range of motion.  Neurological: She is alert.  Skin: Skin is warm and dry.     Psychiatric: She has a normal mood and affect.  Nursing note and vitals reviewed.   ED Course  Procedures (including critical care time) Labs Review Labs Reviewed - No data to display  Imaging Review No results found. I have personally reviewed and evaluated these images and lab results as part of my medical decision-making.   EKG Interpretation None     INCISION AND DRAINAGE Performed by: Santiago Glad Consent: Verbal consent obtained.  Risks and benefits: risks, benefits and alternatives were discussed Type: abscess  Body area: right buttocks  Anesthesia: local infiltration  Incision was made with a scalpel.  Local anesthetic: lidocaine 2% with epinephrine  Anesthetic total: 3 ml  Complexity: complex Blunt dissection to break up loculations  Drainage: purulent  Drainage amount: copious  Patient tolerance: Patient tolerated the procedure well with no immediate complications.    MDM   Final diagnoses:  None   Patient with skin abscess amenable to incision and drainage.  Abscess was not large enough to warrant packing or drain,  wound recheck in 2 days. Encouraged home warm  compresses.  No signs of cellulitis in surrounding skin.  Will d/c to home.  No antibiotic therapy is indicated.  Stable for discharge.  Return precautions given.     Santiago GladHeather Jacole Capley, PA-C 04/06/16 0131  Santiago GladHeather Izadora Roehr, PA-C 04/06/16 0147  Gilda Creasehristopher J Pollina, MD 04/06/16 [phone]

## 2021-10-17 ENCOUNTER — Ambulatory Visit (HOSPITAL_COMMUNITY)
Admission: EM | Admit: 2021-10-17 | Discharge: 2021-10-17 | Disposition: A | Discharge status: Home / Self Care | Payer: Medicaid Other | Attending: Physician Assistant | Admitting: Physician Assistant

## 2021-10-17 ENCOUNTER — Encounter (HOSPITAL_COMMUNITY): Payer: Self-pay

## 2021-10-17 ENCOUNTER — Other Ambulatory Visit: Payer: Self-pay

## 2021-10-17 DIAGNOSIS — J029 Acute pharyngitis, unspecified: Secondary | ICD-10-CM | POA: Insufficient documentation

## 2021-10-17 LAB — POCT RAPID STREP A, ED / UC: Streptococcus, Group A Screen (Direct): NEGATIVE

## 2021-10-17 MED ORDER — AMOXICILLIN-POT CLAVULANATE 875-125 MG PO TABS: 1.0000 | ORAL_TABLET | Freq: Two times a day (BID) | ORAL | 0 refills | Status: DC

## 2021-10-17 NOTE — Discharge Instructions (Signed)
We are going to treat you for an infection given your worsening sore throat with a history of peritonsillar abscess.  Please take Augmentin twice daily for 7 days.  If anything worsens you need to go to the emergency room for CT scan as we discussed.  Keep your follow-up appointment with your primary care provider first thing next week.  Given your history, I do recommend following up with ENT so please call to schedule an appointment.  If you have any difficulty speaking, difficulty swallowing, shortness of breath, change to her voice you need to go to the ER as we discussed.

## 2021-10-17 NOTE — ED Triage Notes (Signed)
Pt c/o sore throat with difficulty to swallow since Tuesday.

## 2021-10-17 NOTE — ED Provider Notes (Signed)
[facility]    CSN: [id number] Arrival date & time: 10/17/21  1821      History   Chief Complaint Chief Complaint  Patient presents with   Sore Throat    HPI Grace Carson is a 45 y.o. female.   Patient presents today with a 3 to 4-day history of worsening sore throat.  Reports that each day sore throat has gradually been worsening to the point she is now having difficulty eating and speaking due to pain.  Pain is rated 6/7 on a 0-10 pain scale, localized to left posterior oropharynx, described as sharp, no aggravating or relieving factors identified.  She does have a history of peritonsillar abscess and states current symptoms are similar to previous episodes of this condition.  She has had her tonsils removed but has not seen ENT recently.  She denies any additional symptoms including fever, cough, nasal congestion, nausea, vomiting.  Denies any recent antibiotic use.  She has tried Tylenol without improvement of symptoms.  She is unable to take NSAIDs due to chronic kidney disease and is prediabetic so his doctor said and steroids at this time.   Past Medical History:  Diagnosis Date   Bipolar 1 disorder (HCC)    Depression    Post traumatic stress disorder (PTSD)    Social anxiety disorder     There are no problems to display for this patient.   Past Surgical History:  Procedure Laterality Date   CESAREAN SECTION     TONSILLECTOMY     TOOTH EXTRACTION      OB History   No obstetric history on file.      Home Medications    Prior to Admission medications   Medication Sig Start Date End Date Taking? Authorizing Provider  amoxicillin-clavulanate (AUGMENTIN) 875-125 MG tablet Take 1 tablet by mouth every 12 (twelve) hours. 10/17/21  Yes Riddhi Grether, Noberto Retort, PA-C  estradiol (ESTRACE) 0.5 MG tablet 1 tab(s) 10/02/21  Yes [provider]  oxyCODONE-acetaminophen (PERCOCET/ROXICET) 5-325 MG tablet Take by mouth every 4 (four) hours as needed for severe  pain.   Yes [provider]  gabapentin (NEURONTIN) 100 MG capsule 1 cap(s)    [provider]  lurasidone (LATUDA) 40 MG TABS tablet Take 20 mg by mouth daily with breakfast.    [provider]  meloxicam (MOBIC) 15 MG tablet 1 tab(s)    [provider]    Family History History reviewed. No pertinent family history.  Social History Social History   Tobacco Use   Smoking status: Former    Packs/day: 0.50    Types: Cigarettes   Smokeless tobacco: Never  Substance Use Topics   Alcohol use: Not Currently    Comment: occa   Drug use: No     Allergies   Orange juice [orange oil]   Review of Systems Review of Systems  Constitutional:  Positive for activity change and fatigue. Negative for fever.  HENT:  Positive for sore throat, trouble swallowing and voice change. Negative for congestion, sinus pressure and sneezing.   Respiratory:  Negative for cough and shortness of breath.   Cardiovascular:  Negative for chest pain.  Gastrointestinal:  Negative for abdominal pain, diarrhea, nausea and vomiting.  Neurological:  Negative for dizziness, light-headedness and headaches.    Physical Exam Triage Vital Signs ED Triage Vitals  Enc Vitals Group     BP 10/17/21 1901 125/72     Pulse Rate 10/17/21 1901 89  Resp 10/17/21 1901 18     Temp 10/17/21 1901 98.5 F (36.9 C)     Temp Source 10/17/21 1901 Oral     SpO2 10/17/21 1901 96 %     Weight --      Height --      Head Circumference --      Peak Flow --      Pain Score 10/17/21 1902 6     Pain Loc --      Pain Edu? --      Excl. in GC? --    No data found.  Updated Vital Signs BP 125/72 (BP Location: Left Arm)   Pulse 89   Temp 98.5 F (36.9 C) (Oral)   Resp 18   LMP 06/04/2014   SpO2 96%   Visual Acuity Right Eye Distance:   Left Eye Distance:   Bilateral Distance:    Right Eye Near:   Left Eye Near:    Bilateral Near:     Physical Exam Vitals reviewed.   Constitutional:      General: She is awake. She is not in acute distress.    Appearance: Normal appearance. She is well-developed. She is not ill-appearing.     Comments: Very pleasant female appears stated age no acute distress sitting comfortably in exam room  HENT:     Head: Normocephalic and atraumatic.     Right Ear: Tympanic membrane, ear canal and external ear normal. Tympanic membrane is not erythematous or bulging.     Left Ear: Tympanic membrane, ear canal and external ear normal. Tympanic membrane is not erythematous or bulging.     Nose:     Right Sinus: No maxillary sinus tenderness or frontal sinus tenderness.     Left Sinus: No maxillary sinus tenderness or frontal sinus tenderness.     Mouth/Throat:     Pharynx: Uvula midline. Posterior oropharyngeal erythema present. No oropharyngeal exudate.     Tonsils: No tonsillar exudate or tonsillar abscesses. 0 on the right. 0 on the left.     Comments: Significant erythema and swelling left tonsillar pillar.  Tonsils surgically absent.  Clear oropharynx. Cardiovascular:     Rate and Rhythm: Normal rate and regular rhythm.     Heart sounds: Normal heart sounds, S1 normal and S2 normal. No murmur heard. Pulmonary:     Effort: Pulmonary effort is normal.     Breath sounds: Normal breath sounds. No wheezing, rhonchi or rales.     Comments: Clear to auscultation bilaterally Psychiatric:        Behavior: Behavior is cooperative.     UC Treatments / Results  Labs (all labs ordered are listed, but only abnormal results are displayed) Labs Reviewed  CULTURE, GROUP A STREP [facility])  POCT RAPID STREP A, ED / UC    EKG   Radiology No results found.  Procedures Procedures (including critical care time)  Medications Ordered in UC Medications - No data to display  Initial Impression / Assessment and Plan / UC Course  I have reviewed the triage vital signs and the nursing notes.  Pertinent labs & imaging results that were  available during my care of the patient were reviewed by me and considered in my medical decision making (see chart for details).     Vital signs and physical exam reassuring today; no indication for emergent evaluation or imaging.  Patient is able to speak and swallow though it is uncomfortable.  Given history of peritonsillar abscess with similar symptoms  we will start Augmentin.  Offered prednisone but patient declined this as she is currently prediabetic and trying very hard to change her diet so we will defer steroids for the time being.  Patient was given contact information for an ENT given recurrent symptoms.  Discussed that if she has any worsening symptoms including swelling of her throat, difficulty swallowing, shortness of breath, change to her voice she needs to go to the emergency room for CT.  Strict return precautions given to which she expressed understanding.  Final Clinical Impressions(s) / UC Diagnoses   Final diagnoses:  Acute pharyngitis, unspecified etiology  Sore throat     Discharge Instructions      We are going to treat you for an infection given your worsening sore throat with a history of peritonsillar abscess.  Please take Augmentin twice daily for 7 days.  If anything worsens you need to go to the emergency room for CT scan as we discussed.  Keep your follow-up appointment with your primary care provider first thing next week.  Given your history, I do recommend following up with ENT so please call to schedule an appointment.  If you have any difficulty speaking, difficulty swallowing, shortness of breath, change to her voice you need to go to the ER as we discussed.     ED Prescriptions     Medication Sig Dispense Auth. Provider   amoxicillin-clavulanate (AUGMENTIN) 875-125 MG tablet Take 1 tablet by mouth every 12 (twelve) hours. 14 tablet Elysa Womac, Noberto Retort, PA-C      PDMP not reviewed this encounter.   Jeani Hawking, PA-C 10/17/21 1936

## 2021-10-20 LAB — CULTURE, GROUP A STREP (THRC)

## 2022-06-10 ENCOUNTER — Emergency Department (HOSPITAL_COMMUNITY): Payer: Medicaid Other

## 2022-06-10 ENCOUNTER — Other Ambulatory Visit: Payer: Self-pay

## 2022-06-10 ENCOUNTER — Emergency Department (HOSPITAL_COMMUNITY)
Admission: EM | Admit: 2022-06-10 | Discharge: 2022-06-10 | Disposition: A | Discharge status: Home / Self Care | Payer: Medicaid Other | Attending: Emergency Medicine | Admitting: Emergency Medicine

## 2022-06-10 ENCOUNTER — Encounter (HOSPITAL_COMMUNITY): Payer: Self-pay | Admitting: Emergency Medicine

## 2022-06-10 DIAGNOSIS — R519 Headache, unspecified: Secondary | ICD-10-CM | POA: Insufficient documentation

## 2022-06-10 LAB — CBC
HCT: 38.6 % (ref 36.0–46.0)
Hemoglobin: 12.8 g/dL (ref 12.0–15.0)
MCH: 31.9 pg (ref 26.0–34.0)
MCHC: 33.2 g/dL (ref 30.0–36.0)
MCV: 96.3 fL (ref 80.0–100.0)
Platelets: 287 10*3/uL (ref 150–400)
RBC: 4.01 MIL/uL (ref 3.87–5.11)
RDW: 14.5 % (ref 11.5–15.5)
WBC: 8 10*3/uL (ref 4.0–10.5)
nRBC: 0 % (ref 0.0–0.2)

## 2022-06-10 LAB — BASIC METABOLIC PANEL
Anion gap: 4 — ABNORMAL LOW (ref 5–15)
BUN: 16 mg/dL (ref 6–20)
CO2: 19 mmol/L — ABNORMAL LOW (ref 22–32)
Calcium: 8.9 mg/dL (ref 8.9–10.3)
Chloride: 114 mmol/L — ABNORMAL HIGH (ref 98–111)
Creatinine, Ser: 0.95 mg/dL (ref 0.44–1.00)
GFR, Estimated: >60 mL/min (ref >60)
Glucose, Bld: 130 mg/dL — ABNORMAL HIGH (ref 70–99)
Potassium: 3.4 mmol/L — ABNORMAL LOW (ref 3.5–5.1)
Sodium: 137 mmol/L (ref 135–145)

## 2022-06-10 MED ORDER — PROCHLORPERAZINE EDISYLATE 10 MG/2ML IJ SOLN
10.0000 mg | Freq: Once | INTRAMUSCULAR | Status: AC
  Administered 2022-06-10: 10 mg via INTRAVENOUS
  Filled 2022-06-10: qty 2

## 2022-06-10 MED ORDER — DIPHENHYDRAMINE HCL 50 MG/ML IJ SOLN
25.0000 mg | Freq: Once | INTRAMUSCULAR | Status: AC
  Administered 2022-06-10: 25 mg via INTRAVENOUS
  Filled 2022-06-10: qty 1

## 2022-06-10 NOTE — ED Provider Triage Note (Signed)
Emergency Medicine Provider Triage Evaluation Note  Grace Carson , a 46 y.o. female  was evaluated in triage.  Pt complains of headache.  Patient states that earlier today she was in her usual state of health when she suddenly developed a headache around 1 PM.  The patient reports that the headache is progressively worsened throughout the day, denies any kind of sudden onset of severe excruciating pain.  Patient denies history of migraines, denies history of headaches.  Patient reports that the headache is changing in quality, states that the pain is "moving".  Patient denies any lightheadedness, weakness, numbness, chest pain, shortness of breath.  Patient neurological examination shows no focal neurodeficits.  Patient is intact strength and sensation upper and lower extremities bilaterally.  Patient reports recent injection due to osteoarthritis of right hip.  Patient reports taking hydrocodone pill prior to arrival for pain.  Review of Systems  Positive:  Negative:   Physical Exam  BP 133/79 (BP Location: Left Arm)   Pulse 79   Temp 99 F (37.2 C) (Oral)   Resp 18   LMP 06/04/2014   SpO2 98%  Gen:   Awake, no distress   Resp:  Normal effort  MSK:   Moves extremities without difficulty  Other:    Medical Decision Making  Medically screening exam initiated at 5:03 PM.  Appropriate orders placed.  Selena Lesser was informed that the remainder of the evaluation will be completed by another provider, this initial triage assessment does not replace that evaluation, and the importance of remaining in the ED until their evaluation is complete.     Azucena Cecil, PA-C 06/10/22 1704

## 2022-06-10 NOTE — Discharge Instructions (Signed)
Follow-up with your primary care doctor to discuss your symptoms from today.  Come back to ER if you are having worsening pain, vomiting, numbness, weakness, speech or vision change or other new concerning symptom.  Recommend taking Tylenol or Motrin as needed at home for your pain.

## 2022-06-10 NOTE — ED Provider Notes (Signed)
[facility]-EMERGENCY DEPT Provider Note   CSN: [id number] Arrival date & time: 06/10/22  1648     History  Chief Complaint  Patient presents with   Headache    Grace Carson is a 46 y.o. female.  Presents to ER for headache.  Patient reports that she started having a headache around 2 PM this afternoon.  Did not start suddenly but did get steadily worse as the afternoon went on.  Has ranged from being on the sides of her head to the front of her head, behind her eyes.  Currently on the front of her head, not having any pain on the sides.  No neck pain or neck stiffness.  No recent fevers or chills.  No numbness, weakness, speech or vision change.  Patient accompanied by her significant other.  HPI     Home Medications Prior to Admission medications   Medication Sig Start Date End Date Taking? Authorizing Provider  amoxicillin-clavulanate (AUGMENTIN) 875-125 MG tablet Take 1 tablet by mouth every 12 (twelve) hours. 10/17/21   Raspet, Noberto RetortErin K, PA-C  estradiol (ESTRACE) 0.5 MG tablet 1 tab(s) 10/02/21   [provider]  gabapentin (NEURONTIN) 100 MG capsule 1 cap(s)    [provider]  lurasidone (LATUDA) 40 MG TABS tablet Take 20 mg by mouth daily with breakfast.    [provider]  meloxicam (MOBIC) 15 MG tablet 1 tab(s)    [provider]  oxyCODONE-acetaminophen (PERCOCET/ROXICET) 5-325 MG tablet Take by mouth every 4 (four) hours as needed for severe pain.    [provider]      Allergies    Orange juice Erskine Emery[orange oil]    Review of Systems   Review of Systems  Constitutional:  Negative for chills and fever.  HENT:  Negative for ear pain and sore throat.   Eyes:  Negative for pain and visual disturbance.  Respiratory:  Negative for cough and shortness of breath.   Cardiovascular:  Negative for chest pain and palpitations.  Gastrointestinal:  Negative for abdominal pain and vomiting.  Genitourinary:   Negative for dysuria and hematuria.  Musculoskeletal:  Negative for arthralgias and back pain.  Skin:  Negative for color change and rash.  Neurological:  Positive for headaches. Negative for seizures and syncope.  All other systems reviewed and are negative.   Physical Exam Updated Vital Signs BP 132/79   Pulse (!) 59   Temp 99 F (37.2 C) (Oral)   Resp 18   LMP 06/04/2014   SpO2 100%  Physical Exam Vitals and nursing note reviewed.  Constitutional:      General: She is not in acute distress.    Appearance: She is well-developed.  HENT:     Head: Normocephalic and atraumatic.  Eyes:     Conjunctiva/sclera: Conjunctivae normal.  Cardiovascular:     Rate and Rhythm: Normal rate and regular rhythm.     Heart sounds: No murmur heard. Pulmonary:     Effort: Pulmonary effort is normal. No respiratory distress.     Breath sounds: Normal breath sounds.  Abdominal:     Palpations: Abdomen is soft.     Tenderness: There is no abdominal tenderness.  Musculoskeletal:        General: No swelling.     Cervical back: Neck supple.  Skin:    General: Skin is warm and dry.     Capillary Refill: Capillary refill takes less than 2 seconds.  Neurological:     Mental Status:  She is alert.     Comments: AAOx3 CN 2-12 intact, speech clear visual fields intact 5/5 strength in b/l UE and LE Sensation to light touch intact in b/l UE and LE Normal FNF Normal gait  Psychiatric:        Mood and Affect: Mood normal.     ED Results / Procedures / Treatments   Labs (all labs ordered are listed, but only abnormal results are displayed) Labs Reviewed  BASIC METABOLIC PANEL - Abnormal; Notable for the following components:      Result Value   Potassium 3.4 (*)    Chloride 114 (*)    CO2 19 (*)    Glucose, Bld 130 (*)    Anion gap 4 (*)    All other components within normal limits  CBC  I-STAT BETA HCG BLOOD, ED ([facility], [facility], [facility] ONLY)    EKG None  Radiology CT Head Wo  Contrast  Result Date: 06/10/2022 CLINICAL DATA:  Headache, sudden, severe EXAM: CT HEAD WITHOUT CONTRAST TECHNIQUE: Contiguous axial images were obtained from the base of the skull through the vertex without intravenous contrast. RADIATION DOSE REDUCTION: This exam was performed according to the departmental dose-optimization program which includes automated exposure control, adjustment of the mA and/or kV according to patient size and/or use of iterative reconstruction technique. COMPARISON:  Remote head CT 04/28/2010 FINDINGS: Brain: No intracranial hemorrhage, mass effect, or midline shift. No hydrocephalus. The basilar cisterns are patent. No evidence of territorial infarct or acute ischemia. No extra-axial or intracranial fluid collection. Vascular: No hyperdense vessel or unexpected calcification. Skull: Normal. Negative for fracture or focal lesion. Sinuses/Orbits: Mucous retention cyst in the left maxillary sinus, chronic. No acute findings. Other: None. IMPRESSION: No acute intracranial abnormality. Electronically Signed   By: Narda Rutherford M.D.   On: 06/10/2022 18:10    Procedures Procedures    Medications Ordered in ED Medications  prochlorperazine (COMPAZINE) injection 10 mg (10 mg Intravenous Given 06/10/22 2119)  diphenhydrAMINE (BENADRYL) injection 25 mg (25 mg Intravenous Given 06/10/22 2119)    ED Course/ Medical Decision Making/ A&P                           Medical Decision Making Risk Prescription drug management.   46 year old presenting to ER for headache.  Was not sudden onset but did eventually become severe.  When I evaluated her she appeared well and was in no distress and had a normal neurologic exam.  CT of her head was obtained, no acute findings.  I independently reviewed and interpreted results.  Agree with radiology report.  No evidence for [facility] at this time.  Patient was provided headache cocktail.  Reassessed, she denies any ongoing headache at this time.   Given resolution of symptoms, work-up today, feel she can be discharged and managed in outpatient setting.  Advised follow-up with primary care.  Reviewed basic labs, slight hypokalemia, no anemia, no leukocytosis.    After the discussed management above, the patient was determined to be safe for discharge.  The patient was in agreement with this plan and all questions regarding their care were answered.  ED return precautions were discussed and the patient will return to the ED with any significant worsening of condition.         Final Clinical Impression(s) / ED Diagnoses Final diagnoses:  Nonintractable headache, unspecified chronicity pattern, unspecified headache type    Rx / DC Orders ED Discharge Orders  None         Milagros Loll, MD 06/10/22 2227

## 2022-06-10 NOTE — ED Triage Notes (Signed)
BIBA Per EMS: Pt coming from home w/ c/o headache since 3pm. No hx migraines.  Recent steroid injection for hip pain  150/90 80HR  98% RA

## 2022-07-02 ENCOUNTER — Ambulatory Visit: Payer: Medicaid Other

## 2022-07-22 NOTE — H&P (Signed)
HIP ARTHROPLASTY ADMISSION H&P  Patient ID: Grace Carson MRN: [id number] DOB/AGE: April 02, 1976 46 y.o.  Chief Complaint: right hip pain.  Planned Procedure Date: 08/11/22 Medical and Cardiac Clearance by Norva Riffle PA-C   PM&R clearance by Cletis Media PA-C who wants [place] to manage her post-op pain meds   HPI: Grace Carson is a 46 y.o. female who presents for evaluation of oa right hip. The patient has a history of pain and functional disability in the right hip due to arthritis and has failed non-surgical conservative treatments for greater than 12 weeks to include NSAID's and/or analgesics, corticosteriod injections, use of assistive devices, and activity modification.  Onset of symptoms was gradual, starting 3 years ago with gradually worsening course since that time. The patient noted no past surgery on the right hip.  Patient currently rates pain at 10 out of 10 with activity. Patient has night pain, worsening of pain with activity and weight bearing, and pain that interferes with activities of daily living.  Patient has evidence of subchondral sclerosis, periarticular osteophytes, joint space narrowing, and osteolysis  by imaging studies.  There is no active infection.  Past Medical History:  Diagnosis Date   Bipolar 1 disorder (HCC)    Depression    Post traumatic stress disorder (PTSD)    Social anxiety disorder    Past Surgical History:  Procedure Laterality Date   CESAREAN SECTION     TONSILLECTOMY     TOOTH EXTRACTION     Allergies  Allergen Reactions   Orange Juice [Orange Oil] Nausea And Vomiting   Prior to Admission medications   Medication Sig Start Date End Date Taking? Authorizing Provider  amoxicillin-clavulanate (AUGMENTIN) 875-125 MG tablet Take 1 tablet by mouth every 12 (twelve) hours. 10/17/21   Raspet, Noberto Retort, PA-C  estradiol (ESTRACE) 0.5 MG tablet 1 tab(s) 10/02/21   [provider]  gabapentin (NEURONTIN) 100 MG capsule 1 cap(s)     [provider]  lurasidone (LATUDA) 40 MG TABS tablet Take 20 mg by mouth daily with breakfast.    [provider]  meloxicam (MOBIC) 15 MG tablet 1 tab(s)    [provider]  oxyCODONE-acetaminophen (PERCOCET/ROXICET) 5-325 MG tablet Take by mouth every 4 (four) hours as needed for severe pain.    [provider]   Social History   Socioeconomic History   Marital status: Single    Spouse name: Not on file   Number of children: Not on file   Years of education: Not on file   Highest education level: Not on file  Occupational History   Not on file  Tobacco Use   Smoking status: Former    Packs/day: 0.50    Types: Cigarettes   Smokeless tobacco: Never  Substance and Sexual Activity   Alcohol use: Not Currently    Comment: occa   Drug use: No   Sexual activity: Not on file  Other Topics Concern   Not on file  Social History Narrative   Not on file   Social Determinants of Health   Financial Resource Strain: Not on file  Food Insecurity: Not on file  Transportation Needs: Not on file  Physical Activity: Not on file  Stress: Not on file  Social Connections: Not on file   No family history on file.  ROS: Currently denies lightheadedness, dizziness, Fever, chills, CP, SOB.   No personal history of DVT, PE, MI, or CVA. No loose teeth or dentures All other systems have been  reviewed and were otherwise currently negative with the exception of those mentioned in the HPI and as above.  Objective: Vitals: Ht: 5'6" Wt: 220.8 lbs Temp: 98.0 BP: 113/76 Pulse: 74 O2 98% on room air.   Physical Exam: General: Alert, NAD. Trendelenberg Gait  HEENT: EOMI, Good Neck Extension  Pulm: No increased work of breathing.  Clear B/L A/P w/o crackle or wheeze.  CV: RRR, No m/g/r appreciated  GI: soft, NT, ND. BS x 4 quadrants Neuro: CN II-XII grossly intact without focal deficit.  Sensation intact distally Skin: No lesions in the area of chief  complaint MSK/Surgical Site: non- TTP. Hip ROM decreased d/t pain. + Stinchfield. + SLR. + FABER/FADIR. Decreased strength.  NVI.    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   The bone quality appears to be fair for age and reported activity level.  Preoperative templating of the joint replacement has been completed, documented, and submitted to the Operating Room personnel in order to optimize intra-operative equipment management.  Assessment: oa right hip Active Problems:   * No active hospital problems. *   Plan: Plan for Procedure(s): TOTAL HIP ARTHROPLASTY ANTERIOR APPROACH  The patient history, physical exam, clinical judgement of the provider and imaging are consistent with end stage degenerative joint disease and total joint arthroplasty is deemed medically necessary. The treatment options including medical management, injection therapy, and arthroplasty were discussed at length. The risks and benefits of Procedure(s): TOTAL HIP ARTHROPLASTY ANTERIOR APPROACH were presented and reviewed.  The risks of nonoperative treatment, versus surgical intervention including but not limited to continued pain, aseptic loosening, stiffness, dislocation/subluxation, infection, bleeding, nerve injury, blood clots, cardiopulmonary complications, morbidity, mortality, among others were discussed. The patient verbalizes understanding and wishes to proceed with the plan.  Patient is being admitted for surgery, pain control, PT, prophylactic antibiotics, VTE prophylaxis, progressive ambulation, ADL's and discharge planning.   Dental prophylaxis discussed and recommended for 2 years postoperatively.  The patient does meet the criteria for TXA which will be used perioperatively.   ASA 81 mg BID will be used postoperatively for DVT prophylaxis in addition to SCDs, and early ambulation. Plan for Oxycodone, Celebrex, Tylenol for pain.   Robaxin for  muscle spasm.  Zofran for nausea and vomiting. Colace for constipation prevention [facility]- [facility] The patient is planning to be discharged home with OPPT and into the care of her girlfriend Grace Carson who can be reached at [phone] Follow up appt 08/26/22 at 4:15pm     [place] Board Office [phone] 07/22/2022 6:54 PM

## 2022-07-30 NOTE — Patient Instructions (Signed)
DUE TO COVID-19 ONLY TWO VISITORS  (aged 44 and older)  ARE ALLOWED TO COME WITH YOU AND STAY IN THE WAITING ROOM ONLY DURING PRE OP AND PROCEDURE.   **NO VISITORS ARE ALLOWED IN THE SHORT STAY AREA OR RECOVERY ROOM!!**  IF YOU WILL BE ADMITTED INTO THE HOSPITAL YOU ARE ALLOWED ONLY FOUR SUPPORT PEOPLE DURING VISITATION HOURS ONLY (7 AM -8PM)   The support person(s) must pass our screening, gel in and out, and wear a mask at all times, including in the patient's room. Patients must also wear a mask when staff or their support person are in the room. Visitors GUEST BADGE MUST BE WORN VISIBLY  One adult visitor may remain with you overnight and MUST be in the room by 8 P.M.     Your procedure is scheduled on: 08/11/22   [facility]    Report to admitting at : 5:15 AM   Call this number if you have problems the morning of surgery [phone]   Do not eat food :After Midnight.   After Midnight you may have the following liquids until : 4:30 AM DAY OF SURGERY  Water Black Coffee (sugar ok, NO MILK/CREAM OR CREAMERS)  Tea (sugar ok, NO MILK/CREAM OR CREAMERS) regular and decaf                             Plain Jell-O (NO RED)                                           Fruit ices (not with fruit pulp, NO RED)                                     Popsicles (NO RED)                                                                  Juice: apple, WHITE grape, WHITE cranberry Sports drinks like Gatorade (NO RED)              Drink Ensure drink AT: 4:30 AM the morning of surgery.     The day of surgery:  Drink ONE (1) Pre-Surgery Clear Ensure or G2 at AM the morning of surgery. Drink in one sitting. Do not sip.  This drink was given to you during your hospital  pre-op appointment visit. Nothing else to drink after completing the  Pre-Surgery Clear Ensure or G2.          If you have questions, please contact your surgeon's office.    Oral Hygiene is also  important to reduce your risk of infection.                                    Remember - BRUSH YOUR TEETH THE MORNING OF SURGERY WITH YOUR REGULAR TOOTHPASTE   Do NOT smoke after Midnight   Take these medicines the morning of surgery with A SIP  OF WATER: topiramate,omeprazole.Tylenol as needed.  DO NOT TAKE ANY ORAL DIABETIC MEDICATIONS DAY OF YOUR SURGERY  Bring CPAP mask and tubing day of surgery.                              You may not have any metal on your body including hair pins, jewelry, and body piercing             Do not wear make-up, lotions, powders, perfumes/cologne, or deodorant  Do not wear nail polish including gel and S&S, artificial/acrylic nails, or any other type of covering on natural nails including finger and toenails. If you have artificial nails, gel coating, etc. that needs to be removed by a nail salon please have this removed prior to surgery or surgery may need to be canceled/ delayed if the surgeon/ anesthesia feels like they are unable to be safely monitored.   Do not shave  48 hours prior to surgery.   Do not bring valuables to the hospital. [facility] IS NOT             RESPONSIBLE   FOR VALUABLES.   Contacts, dentures or bridgework may not be worn into surgery.   Bring small overnight bag day of surgery.   DO NOT BRING YOUR HOME MEDICATIONS TO THE HOSPITAL. PHARMACY WILL DISPENSE MEDICATIONS LISTED ON YOUR MEDICATION LIST TO YOU DURING YOUR ADMISSION IN THE HOSPITAL!    Patients discharged on the day of surgery will not be allowed to drive home.  Someone NEEDS to stay with you for the first 24 hours after anesthesia.   Special Instructions: Bring a copy of your healthcare power of attorney and living will documents         the day of surgery if you haven't scanned them before.              Please read over the following fact sheets you were given: IF YOU HAVE QUESTIONS ABOUT YOUR PRE-OP INSTRUCTIONS PLEASE CALL [phone]     [facility] -  Preparing for Surgery Before surgery, you can play an important role.  Because skin is not sterile, your skin needs to be as free of germs as possible.  You can reduce the number of germs on your skin by washing with CHG (chlorahexidine gluconate) soap before surgery.  CHG is an antiseptic cleaner which kills germs and bonds with the skin to continue killing germs even after washing. Please DO NOT use if you have an allergy to CHG or antibacterial soaps.  If your skin becomes reddened/irritated stop using the CHG and inform your nurse when you arrive at Short Stay. Do not shave (including legs and underarms) for at least 48 hours prior to the first CHG shower.  You may shave your face/neck. Please follow these instructions carefully:  1.  Shower with CHG Soap the night before surgery and the  morning of Surgery.  2.  If you choose to wash your hair, wash your hair first as usual with your  normal  shampoo.  3.  After you shampoo, rinse your hair and body thoroughly to remove the  shampoo.                           4.  Use CHG as you would any other liquid soap.  You can apply chg directly  to the skin and wash  Gently with a scrungie or clean washcloth.  5.  Apply the CHG Soap to your body ONLY FROM THE NECK DOWN.   Do not use on face/ open                           Wound or open sores. Avoid contact with eyes, ears mouth and genitals (private parts).                       Wash face,  Genitals (private parts) with your normal soap.             6.  Wash thoroughly, paying special attention to the area where your surgery  will be performed.  7.  Thoroughly rinse your body with warm water from the neck down.  8.  DO NOT shower/wash with your normal soap after using and rinsing off  the CHG Soap.                9.  Pat yourself dry with a clean towel.            10.  Wear clean pajamas.            11.  Place clean sheets on your bed the night of your first shower and do not  sleep  with pets. Day of Surgery : Do not apply any lotions/deodorants the morning of surgery.  Please wear clean clothes to the hospital/surgery center.  FAILURE TO FOLLOW THESE INSTRUCTIONS MAY RESULT IN THE CANCELLATION OF YOUR SURGERY PATIENT SIGNATURE_________________________________  NURSE SIGNATURE__________________________________  ________________________________________________________________________   Adam Phenix  An incentive spirometer is a tool that can help keep your lungs clear and active. This tool measures how well you are filling your lungs with each breath. Taking long deep breaths may help reverse or decrease the chance of developing breathing (pulmonary) problems (especially infection) following: A long period of time when you are unable to move or be active. BEFORE THE PROCEDURE  If the spirometer includes an indicator to show your best effort, your nurse or respiratory therapist will set it to a desired goal. If possible, sit up straight or lean slightly forward. Try not to slouch. Hold the incentive spirometer in an upright position. INSTRUCTIONS FOR USE  Sit on the edge of your bed if possible, or sit up as far as you can in bed or on a chair. Hold the incentive spirometer in an upright position. Breathe out normally. Place the mouthpiece in your mouth and seal your lips tightly around it. Breathe in slowly and as deeply as possible, raising the piston or the ball toward the top of the column. Hold your breath for 3-5 seconds or for as long as possible. Allow the piston or ball to fall to the bottom of the column. Remove the mouthpiece from your mouth and breathe out normally. Rest for a few seconds and repeat Steps 1 through 7 at least 10 times every 1-2 hours when you are awake. Take your time and take a few normal breaths between deep breaths. The spirometer may include an indicator to show your best effort. Use the indicator as a goal to work toward during  each repetition. After each set of 10 deep breaths, practice coughing to be sure your lungs are clear. If you have an incision (the cut made at the time of surgery), support your incision when coughing by placing a pillow or rolled up towels  firmly against it. Once you are able to get out of bed, walk around indoors and cough well. You may stop using the incentive spirometer when instructed by your caregiver.  RISKS AND COMPLICATIONS Take your time so you do not get dizzy or light-headed. If you are in pain, you may need to take or ask for pain medication before doing incentive spirometry. It is harder to take a deep breath if you are having pain. AFTER USE Rest and breathe slowly and easily. It can be helpful to keep track of a log of your progress. Your caregiver can provide you with a simple table to help with this. If you are using the spirometer at home, follow these instructions: [place] IF:  You are having difficultly using the spirometer. You have trouble using the spirometer as often as instructed. Your pain medication is not giving enough relief while using the spirometer. You develop fever of 100.5 F (38.1 C) or higher. SEEK IMMEDIATE MEDICAL CARE IF:  You cough up bloody sputum that had not been present before. You develop fever of 102 F (38.9 C) or greater. You develop worsening pain at or near the incision site. MAKE SURE YOU:  Understand these instructions. Will watch your condition. Will get help right away if you are not doing well or get worse. Document Released: 04/26/2007 Document Revised: 03/07/2012 Document Reviewed: 06/27/2007 [facility] Patient Information 2014 [place], [place].   ________________________________________________________________________

## 2022-07-31 ENCOUNTER — Other Ambulatory Visit: Payer: Self-pay

## 2022-07-31 ENCOUNTER — Encounter (HOSPITAL_COMMUNITY): Payer: Self-pay | Admitting: *Deleted

## 2022-07-31 ENCOUNTER — Encounter (HOSPITAL_COMMUNITY)
Admission: RE | Admit: 2022-07-31 | Discharge: 2022-07-31 | Disposition: A | Discharge status: Home / Self Care | Payer: Medicaid Other | Source: Ambulatory Visit | Attending: Orthopedic Surgery | Admitting: Orthopedic Surgery

## 2022-07-31 VITALS — BP 125/71 | HR 60 | Temp 98.2°F | Ht 66.0 in | Wt 216.0 lb

## 2022-07-31 DIAGNOSIS — Z01812 Encounter for preprocedural laboratory examination: Secondary | ICD-10-CM | POA: Insufficient documentation

## 2022-07-31 DIAGNOSIS — Z01818 Encounter for other preprocedural examination: Secondary | ICD-10-CM

## 2022-07-31 HISTORY — DX: Unspecified osteoarthritis, unspecified site: M19.90

## 2022-07-31 LAB — CBC
HCT: 41.8 % (ref 36.0–46.0)
Hemoglobin: 13.7 g/dL (ref 12.0–15.0)
MCH: 31.9 pg (ref 26.0–34.0)
MCHC: 32.8 g/dL (ref 30.0–36.0)
MCV: 97.2 fL (ref 80.0–100.0)
Platelets: 266 10*3/uL (ref 150–400)
RBC: 4.3 MIL/uL (ref 3.87–5.11)
RDW: 13.6 % (ref 11.5–15.5)
WBC: 3.8 10*3/uL — ABNORMAL LOW (ref 4.0–10.5)
nRBC: 0 % (ref 0.0–0.2)

## 2022-07-31 LAB — TYPE AND SCREEN
ABO/RH(D): B POS
Antibody Screen: NEGATIVE

## 2022-07-31 LAB — SURGICAL PCR SCREEN
MRSA, PCR: NEGATIVE
Staphylococcus aureus: NEGATIVE

## 2022-07-31 NOTE — Progress Notes (Signed)
For Short Stay: COVID SWAB appointment date: Date of COVID positive in last 90 days:  Bowel Prep reminder:   For Anesthesia: PCP - Norva Riffle: PA. : Clearance: 06/15/22: Chart. Cardiologist -   Chest x-ray -  EKG -  Stress Test -  ECHO -  Cardiac Cath -  Pacemaker/ICD device last checked: Pacemaker orders received: Device Rep notified:  Spinal Cord Stimulator:  Sleep Study -  CPAP -   Fasting Blood Sugar -  Checks Blood Sugar _____ times a day Date and result of last Hgb A1c-  Blood Thinner Instructions: Aspirin Instructions: Last Dose:  Activity level: Can go up a flight of stairs and activities of daily living without stopping and without chest pain and/or shortness of breath   Able to exercise without chest pain and/or shortness of breath   Unable to go up a flight of stairs without chest pain and/or shortness of breath     Anesthesia review:  Patient denies shortness of breath, fever, cough and chest pain at PAT appointment   Patient verbalized understanding of instructions that were given to them at the PAT appointment. Patient was also instructed that they will need to review over the PAT instructions again at home before surgery.

## 2022-08-10 NOTE — Anesthesia Preprocedure Evaluation (Addendum)
Anesthesia Evaluation  Patient identified by MRN, date of birth, ID band Patient awake    Reviewed: Allergy & Precautions, H&P , NPO status , Patient's Chart, lab work & pertinent test results  Airway Mallampati: I  TM Distance: >3 FB Neck ROM: Full    Dental no notable dental hx. (+) Teeth Intact, Dental Advisory Given   Pulmonary neg pulmonary ROS, former smoker,    Pulmonary exam normal breath sounds clear to auscultation       Cardiovascular Exercise Tolerance: Good negative cardio ROS Normal cardiovascular exam Rhythm:Regular Rate:Normal     Neuro/Psych PSYCHIATRIC DISORDERS Anxiety Depression Bipolar Disorder negative neurological ROS  negative psych ROS   GI/Hepatic negative GI ROS, Neg liver ROS,   Endo/Other  negative endocrine ROSMorbid obesity  Renal/GU negative Renal ROS  negative genitourinary   Musculoskeletal negative musculoskeletal ROS (+) Arthritis , Osteoarthritis,    Abdominal   Peds negative pediatric ROS (+)  Hematology negative hematology ROS (+)   Anesthesia Other Findings   Reproductive/Obstetrics negative OB ROS                            Anesthesia Physical Anesthesia Plan  ASA: 2  Anesthesia Plan: MAC and Spinal   Post-op Pain Management: Minimal or no pain anticipated   Induction: Intravenous  PONV Risk Score and Plan: 2 and Ondansetron, Dexamethasone, Treatment may vary due to age or medical condition and Propofol infusion  Airway Management Planned: Natural Airway and Nasal Cannula  Additional Equipment: None  Intra-op Plan:   Post-operative Plan:   Informed Consent: I have reviewed the patients History and Physical, chart, labs and discussed the procedure including the risks, benefits and alternatives for the proposed anesthesia with the patient or authorized representative who has indicated his/her understanding and acceptance.       Plan  Discussed with: Anesthesiologist and CRNA  Anesthesia Plan Comments:        Anesthesia Quick Evaluation

## 2022-08-11 ENCOUNTER — Ambulatory Visit (HOSPITAL_COMMUNITY): Payer: Medicaid Other | Admitting: Anesthesiology

## 2022-08-11 ENCOUNTER — Observation Stay (HOSPITAL_COMMUNITY)
Admission: RE | Admit: 2022-08-11 | Discharge: 2022-08-12 | Disposition: A | Discharge status: Home / Self Care | Payer: Medicaid Other | Attending: Orthopedic Surgery | Admitting: Orthopedic Surgery

## 2022-08-11 ENCOUNTER — Encounter (HOSPITAL_COMMUNITY): Payer: Self-pay | Admitting: Orthopedic Surgery

## 2022-08-11 ENCOUNTER — Ambulatory Visit (HOSPITAL_BASED_OUTPATIENT_CLINIC_OR_DEPARTMENT_OTHER): Payer: Medicaid Other | Admitting: Anesthesiology

## 2022-08-11 ENCOUNTER — Ambulatory Visit (HOSPITAL_COMMUNITY): Payer: Medicaid Other

## 2022-08-11 ENCOUNTER — Encounter (HOSPITAL_COMMUNITY)
Admission: RE | Disposition: A | Discharge status: Home / Self Care | Payer: Self-pay | Source: Home / Self Care | Attending: Orthopedic Surgery

## 2022-08-11 ENCOUNTER — Other Ambulatory Visit: Payer: Self-pay

## 2022-08-11 DIAGNOSIS — Z96649 Presence of unspecified artificial hip joint: Secondary | ICD-10-CM

## 2022-08-11 DIAGNOSIS — M1611 Unilateral primary osteoarthritis, right hip: Principal | ICD-10-CM | POA: Insufficient documentation

## 2022-08-11 DIAGNOSIS — Z87891 Personal history of nicotine dependence: Secondary | ICD-10-CM | POA: Diagnosis not present

## 2022-08-11 DIAGNOSIS — Z79899 Other long term (current) drug therapy: Secondary | ICD-10-CM | POA: Diagnosis not present

## 2022-08-11 DIAGNOSIS — Z96641 Presence of right artificial hip joint: Secondary | ICD-10-CM

## 2022-08-11 DIAGNOSIS — Z01818 Encounter for other preprocedural examination: Secondary | ICD-10-CM

## 2022-08-11 HISTORY — PX: TOTAL HIP ARTHROPLASTY: SHX124

## 2022-08-11 LAB — ABO/RH: ABO/RH(D): B POS

## 2022-08-11 LAB — POCT PREGNANCY, URINE: Preg Test, Ur: NEGATIVE

## 2022-08-11 SURGERY — ARTHROPLASTY, HIP, TOTAL, ANTERIOR APPROACH
Anesthesia: Monitor Anesthesia Care | Site: Hip | Laterality: Right

## 2022-08-11 MED ORDER — PROPOFOL 1000 MG/100ML IV EMUL
INTRAVENOUS | Status: AC
  Filled 2022-08-11: qty 100

## 2022-08-11 MED ORDER — KETOROLAC TROMETHAMINE 30 MG/ML IJ SOLN
INTRAMUSCULAR | Status: AC
  Filled 2022-08-11: qty 1

## 2022-08-11 MED ORDER — METHOCARBAMOL 750 MG PO TABS: 750.0000 mg | ORAL_TABLET | Freq: Three times a day (TID) | ORAL | 0 refills | Status: DC | PRN

## 2022-08-11 MED ORDER — FENTANYL CITRATE PF 50 MCG/ML IJ SOSY
PREFILLED_SYRINGE | INTRAMUSCULAR | Status: AC
  Filled 2022-08-11: qty 2

## 2022-08-11 MED ORDER — LACTATED RINGERS IV BOLUS: 250.0000 mL | Freq: Once | INTRAVENOUS | Status: DC

## 2022-08-11 MED ORDER — METOCLOPRAMIDE HCL 5 MG PO TABS: 5.0000 mg | ORAL_TABLET | Freq: Three times a day (TID) | ORAL | Status: DC | PRN

## 2022-08-11 MED ORDER — METHOCARBAMOL 500 MG IVPB - SIMPLE MED: 500.0000 mg | Freq: Four times a day (QID) | INTRAVENOUS | Status: DC | PRN

## 2022-08-11 MED ORDER — CEFAZOLIN SODIUM-DEXTROSE 2-4 GM/100ML-% IV SOLN
2.0000 g | INTRAVENOUS | Status: AC
  Administered 2022-08-11: 2 g via INTRAVENOUS
  Filled 2022-08-11: qty 100

## 2022-08-11 MED ORDER — TRANEXAMIC ACID-NACL 1000-0.7 MG/100ML-% IV SOLN
1000.0000 mg | INTRAVENOUS | Status: AC
  Administered 2022-08-11: 1000 mg via INTRAVENOUS
  Filled 2022-08-11: qty 100

## 2022-08-11 MED ORDER — OXYCODONE HCL 5 MG PO TABS
ORAL_TABLET | ORAL | Status: AC
  Filled 2022-08-11: qty 2

## 2022-08-11 MED ORDER — ONDANSETRON HCL 4 MG/2ML IJ SOLN: 4.0000 mg | Freq: Four times a day (QID) | INTRAMUSCULAR | Status: DC | PRN

## 2022-08-11 MED ORDER — OXYCODONE HCL 5 MG/5ML PO SOLN: 5.0000 mg | Freq: Once | ORAL | Status: DC | PRN

## 2022-08-11 MED ORDER — BUPIVACAINE IN DEXTROSE 0.75-8.25 % IT SOLN
INTRATHECAL | Status: DC | PRN
  Administered 2022-08-11: 2 mL via INTRATHECAL

## 2022-08-11 MED ORDER — BISACODYL 10 MG RE SUPP: 10.0000 mg | Freq: Every day | RECTAL | Status: DC | PRN

## 2022-08-11 MED ORDER — LIDOCAINE HCL (CARDIAC) PF 100 MG/5ML IV SOSY
PREFILLED_SYRINGE | INTRAVENOUS | Status: DC | PRN
  Administered 2022-08-11: 40 mg via INTRAVENOUS

## 2022-08-11 MED ORDER — HYDROMORPHONE HCL 1 MG/ML IJ SOLN
0.5000 mg | INTRAMUSCULAR | Status: DC | PRN
  Administered 2022-08-11: 1 mg via INTRAVENOUS
  Filled 2022-08-11: qty 1

## 2022-08-11 MED ORDER — METOCLOPRAMIDE HCL 5 MG/ML IJ SOLN: 5.0000 mg | Freq: Three times a day (TID) | INTRAMUSCULAR | Status: DC | PRN

## 2022-08-11 MED ORDER — HYDROMORPHONE HCL 1 MG/ML IJ SOLN
INTRAMUSCULAR | Status: AC
  Filled 2022-08-11: qty 1

## 2022-08-11 MED ORDER — ONDANSETRON HCL 4 MG PO TABS: 4.0000 mg | ORAL_TABLET | Freq: Four times a day (QID) | ORAL | Status: DC | PRN

## 2022-08-11 MED ORDER — WATER FOR IRRIGATION, STERILE IR SOLN
Status: DC | PRN
  Administered 2022-08-11: 2000 mL

## 2022-08-11 MED ORDER — METHOCARBAMOL 500 MG PO TABS
500.0000 mg | ORAL_TABLET | Freq: Four times a day (QID) | ORAL | Status: DC | PRN
  Administered 2022-08-11: 500 mg via ORAL
  Filled 2022-08-11: qty 1

## 2022-08-11 MED ORDER — PANTOPRAZOLE SODIUM 40 MG PO TBEC
40.0000 mg | DELAYED_RELEASE_TABLET | Freq: Every day | ORAL | Status: DC
Start: 2022-08-11 — End: 2022-08-12
  Administered 2022-08-11 – 2022-08-12 (×2): 40 mg via ORAL
  Filled 2022-08-11 (×2): qty 1

## 2022-08-11 MED ORDER — ONDANSETRON HCL 4 MG/2ML IJ SOLN
INTRAMUSCULAR | Status: DC | PRN
  Administered 2022-08-11: 4 mg via INTRAVENOUS

## 2022-08-11 MED ORDER — ACETAMINOPHEN 325 MG PO TABS: 325.0000 mg | ORAL_TABLET | ORAL | Status: DC | PRN

## 2022-08-11 MED ORDER — ACETAMINOPHEN 10 MG/ML IV SOLN
1000.0000 mg | Freq: Once | INTRAVENOUS | Status: DC | PRN
  Administered 2022-08-11: 1000 mg via INTRAVENOUS

## 2022-08-11 MED ORDER — OXYCODONE HCL 5 MG PO TABS
10.0000 mg | ORAL_TABLET | Freq: Once | ORAL | Status: AC | PRN
  Administered 2022-08-11: 10 mg via ORAL

## 2022-08-11 MED ORDER — OXYCODONE HCL 5 MG PO TABS: 5.0000 mg | ORAL_TABLET | Freq: Once | ORAL | Status: DC | PRN

## 2022-08-11 MED ORDER — PROPOFOL 500 MG/50ML IV EMUL
INTRAVENOUS | Status: DC | PRN
  Administered 2022-08-11: 75 ug/kg/min via INTRAVENOUS

## 2022-08-11 MED ORDER — FENTANYL CITRATE PF 50 MCG/ML IJ SOSY
25.0000 ug | PREFILLED_SYRINGE | INTRAMUSCULAR | Status: DC | PRN
  Administered 2022-08-11 (×3): 50 ug via INTRAVENOUS

## 2022-08-11 MED ORDER — ACETAMINOPHEN 160 MG/5ML PO SOLN: 325.0000 mg | ORAL | Status: DC | PRN

## 2022-08-11 MED ORDER — CEFAZOLIN SODIUM-DEXTROSE 2-4 GM/100ML-% IV SOLN
2.0000 g | Freq: Four times a day (QID) | INTRAVENOUS | Status: AC
  Administered 2022-08-11 (×2): 2 g via INTRAVENOUS
  Filled 2022-08-11 (×2): qty 100

## 2022-08-11 MED ORDER — CEFAZOLIN SODIUM-DEXTROSE 2-4 GM/100ML-% IV SOLN: 2.0000 g | Freq: Four times a day (QID) | INTRAVENOUS | Status: DC

## 2022-08-11 MED ORDER — OXYCODONE HCL 5 MG PO TABS
5.0000 mg | ORAL_TABLET | Freq: Four times a day (QID) | ORAL | Status: DC | PRN
  Administered 2022-08-11 (×2): 10 mg via ORAL
  Filled 2022-08-11 (×3): qty 2

## 2022-08-11 MED ORDER — FENTANYL CITRATE (PF) 100 MCG/2ML IJ SOLN
INTRAMUSCULAR | Status: AC
  Filled 2022-08-11: qty 2

## 2022-08-11 MED ORDER — BUPIVACAINE LIPOSOME 1.3 % IJ SUSP: 10.0000 mL | Freq: Once | INTRAMUSCULAR | Status: DC

## 2022-08-11 MED ORDER — ASPIRIN 81 MG PO TBEC: 81.0000 mg | DELAYED_RELEASE_TABLET | Freq: Two times a day (BID) | ORAL | 0 refills | Status: AC

## 2022-08-11 MED ORDER — BUPIVACAINE LIPOSOME 1.3 % IJ SUSP
INTRAMUSCULAR | Status: DC | PRN
  Administered 2022-08-11: 10 mL

## 2022-08-11 MED ORDER — MIDAZOLAM HCL 2 MG/2ML IJ SOLN
INTRAMUSCULAR | Status: AC
  Filled 2022-08-11: qty 2

## 2022-08-11 MED ORDER — CELECOXIB 200 MG PO CAPS: 200.0000 mg | ORAL_CAPSULE | Freq: Two times a day (BID) | ORAL | 0 refills | Status: DC | PRN

## 2022-08-11 MED ORDER — TRANEXAMIC ACID-NACL 1000-0.7 MG/100ML-% IV SOLN
1000.0000 mg | Freq: Once | INTRAVENOUS | Status: AC
  Administered 2022-08-11: 1000 mg via INTRAVENOUS
  Filled 2022-08-11: qty 100

## 2022-08-11 MED ORDER — OXYCODONE HCL 5 MG PO TABS: 5.0000 mg | ORAL_TABLET | ORAL | Status: DC | PRN

## 2022-08-11 MED ORDER — PHENOL 1.4 % MT LIQD: 1.0000 | OROMUCOSAL | Status: DC | PRN

## 2022-08-11 MED ORDER — MIDAZOLAM HCL 2 MG/2ML IJ SOLN
INTRAMUSCULAR | Status: DC | PRN
  Administered 2022-08-11: 2 mg via INTRAVENOUS

## 2022-08-11 MED ORDER — BUPIVACAINE LIPOSOME 1.3 % IJ SUSP
INTRAMUSCULAR | Status: AC
  Filled 2022-08-11: qty 10

## 2022-08-11 MED ORDER — ACETAMINOPHEN 325 MG PO TABS
325.0000 mg | ORAL_TABLET | Freq: Four times a day (QID) | ORAL | Status: DC | PRN
  Administered 2022-08-12: 325 mg via ORAL
  Filled 2022-08-11 (×2): qty 1
  Filled 2022-08-11: qty 2

## 2022-08-11 MED ORDER — PROPOFOL 10 MG/ML IV BOLUS
INTRAVENOUS | Status: AC
  Filled 2022-08-11: qty 20

## 2022-08-11 MED ORDER — ASPIRIN 81 MG PO CHEW
81.0000 mg | CHEWABLE_TABLET | Freq: Two times a day (BID) | ORAL | Status: DC
  Administered 2022-08-11 – 2022-08-12 (×2): 81 mg via ORAL
  Filled 2022-08-11 (×2): qty 1

## 2022-08-11 MED ORDER — OXYCODONE-ACETAMINOPHEN 10-325 MG PO TABS: 1.0000 | ORAL_TABLET | Freq: Every day | ORAL | Status: DC | PRN

## 2022-08-11 MED ORDER — FENTANYL CITRATE PF 50 MCG/ML IJ SOSY
PREFILLED_SYRINGE | INTRAMUSCULAR | Status: AC
  Filled 2022-08-11: qty 1

## 2022-08-11 MED ORDER — ONDANSETRON 4 MG PO TBDP: 4.0000 mg | ORAL_TABLET | Freq: Two times a day (BID) | ORAL | 0 refills | Status: DC | PRN

## 2022-08-11 MED ORDER — TRAMADOL HCL 50 MG PO TABS
50.0000 mg | ORAL_TABLET | Freq: Four times a day (QID) | ORAL | Status: DC
  Administered 2022-08-11 – 2022-08-12 (×5): 50 mg via ORAL
  Filled 2022-08-11 (×5): qty 1

## 2022-08-11 MED ORDER — DOCUSATE SODIUM 100 MG PO CAPS
100.0000 mg | ORAL_CAPSULE | Freq: Two times a day (BID) | ORAL | Status: DC
  Administered 2022-08-11 – 2022-08-12 (×3): 100 mg via ORAL
  Filled 2022-08-11 (×3): qty 1

## 2022-08-11 MED ORDER — SODIUM CHLORIDE FLUSH 0.9 % IV SOLN
INTRAVENOUS | Status: DC | PRN
  Administered 2022-08-11: 10 mL

## 2022-08-11 MED ORDER — HYDROMORPHONE HCL 1 MG/ML IJ SOLN
0.2500 mg | INTRAMUSCULAR | Status: DC | PRN
  Administered 2022-08-11 (×4): 0.5 mg via INTRAVENOUS

## 2022-08-11 MED ORDER — OXYCODONE HCL 5 MG PO TABS
10.0000 mg | ORAL_TABLET | Freq: Every day | ORAL | Status: DC | PRN
  Administered 2022-08-11 – 2022-08-12 (×2): 10 mg via ORAL
  Filled 2022-08-11: qty 2

## 2022-08-11 MED ORDER — ADULT MULTIVITAMIN W/MINERALS CH
1.0000 | ORAL_TABLET | Freq: Every day | ORAL | Status: DC
  Administered 2022-08-11 – 2022-08-12 (×2): 1 via ORAL
  Filled 2022-08-11 (×2): qty 1

## 2022-08-11 MED ORDER — MAGNESIUM CITRATE PO SOLN: 1.0000 | Freq: Once | ORAL | Status: DC | PRN

## 2022-08-11 MED ORDER — ORAL CARE MOUTH RINSE: 15.0000 mL | Freq: Once | OROMUCOSAL | Status: AC

## 2022-08-11 MED ORDER — KETOROLAC TROMETHAMINE 30 MG/ML IJ SOLN: 30.0000 mg | Freq: Once | INTRAMUSCULAR | Status: DC

## 2022-08-11 MED ORDER — DEXAMETHASONE SODIUM PHOSPHATE 10 MG/ML IJ SOLN: 8.0000 mg | Freq: Once | INTRAMUSCULAR | Status: DC

## 2022-08-11 MED ORDER — ACETAMINOPHEN 325 MG PO TABS
325.0000 mg | ORAL_TABLET | Freq: Every day | ORAL | Status: DC | PRN
  Administered 2022-08-11: 325 mg via ORAL
  Filled 2022-08-11: qty 1

## 2022-08-11 MED ORDER — OXYCODONE HCL 5 MG PO TABS: 5.0000 mg | ORAL_TABLET | Freq: Four times a day (QID) | ORAL | 0 refills | Status: AC | PRN

## 2022-08-11 MED ORDER — LACTATED RINGERS IV BOLUS: 500.0000 mL | Freq: Once | INTRAVENOUS | Status: DC

## 2022-08-11 MED ORDER — DOCUSATE SODIUM 100 MG PO CAPS: 100.0000 mg | ORAL_CAPSULE | Freq: Two times a day (BID) | ORAL | 0 refills | Status: AC | PRN

## 2022-08-11 MED ORDER — DIPHENHYDRAMINE HCL 12.5 MG/5ML PO ELIX: 12.5000 mg | ORAL_SOLUTION | ORAL | Status: DC | PRN

## 2022-08-11 MED ORDER — 0.9 % SODIUM CHLORIDE (POUR BTL) OPTIME
TOPICAL | Status: DC | PRN
  Administered 2022-08-11: 1000 mL

## 2022-08-11 MED ORDER — SODIUM CHLORIDE (PF) 0.9 % IJ SOLN
INTRAMUSCULAR | Status: AC
Start: 2022-08-11 — End: ?
  Filled 2022-08-11: qty 10

## 2022-08-11 MED ORDER — MENTHOL 3 MG MT LOZG: 1.0000 | LOZENGE | OROMUCOSAL | Status: DC | PRN

## 2022-08-11 MED ORDER — OXYCODONE HCL 5 MG/5ML PO SOLN: 5.0000 mg | Freq: Once | ORAL | Status: AC | PRN

## 2022-08-11 MED ORDER — MEPERIDINE HCL 50 MG/ML IJ SOLN: 6.2500 mg | INTRAMUSCULAR | Status: DC | PRN

## 2022-08-11 MED ORDER — PROPOFOL 10 MG/ML IV BOLUS
INTRAVENOUS | Status: DC | PRN
  Administered 2022-08-11: 40 mg via INTRAVENOUS
  Administered 2022-08-11 (×2): 20 mg via INTRAVENOUS
  Administered 2022-08-11: 40 mg via INTRAVENOUS
  Administered 2022-08-11 (×2): 20 mg via INTRAVENOUS

## 2022-08-11 MED ORDER — HYDROMORPHONE HCL 2 MG PO TABS: 2.0000 mg | ORAL_TABLET | ORAL | Status: DC | PRN

## 2022-08-11 MED ORDER — TOPIRAMATE 25 MG PO TABS
50.0000 mg | ORAL_TABLET | Freq: Two times a day (BID) | ORAL | Status: DC
  Administered 2022-08-11 – 2022-08-12 (×3): 50 mg via ORAL
  Filled 2022-08-11 (×3): qty 2

## 2022-08-11 MED ORDER — ALUM & MAG HYDROXIDE-SIMETH 200-200-20 MG/5ML PO SUSP: 30.0000 mL | ORAL | Status: DC | PRN

## 2022-08-11 MED ORDER — POVIDONE-IODINE 10 % EX SWAB
2.0000 | Freq: Once | CUTANEOUS | Status: AC
  Administered 2022-08-11: 2 via TOPICAL

## 2022-08-11 MED ORDER — DEXAMETHASONE SODIUM PHOSPHATE 10 MG/ML IJ SOLN
10.0000 mg | Freq: Once | INTRAMUSCULAR | Status: AC
  Administered 2022-08-12: 10 mg via INTRAVENOUS
  Filled 2022-08-11: qty 1

## 2022-08-11 MED ORDER — ALPRAZOLAM ER 0.5 MG PO TB24
0.5000 mg | ORAL_TABLET | Freq: Every day | ORAL | Status: DC
  Administered 2022-08-11 – 2022-08-12 (×2): 0.5 mg via ORAL
  Filled 2022-08-11 (×2): qty 1

## 2022-08-11 MED ORDER — LACTATED RINGERS IV SOLN: INTRAVENOUS | Status: DC

## 2022-08-11 MED ORDER — ONDANSETRON HCL 4 MG/2ML IJ SOLN: 4.0000 mg | Freq: Once | INTRAMUSCULAR | Status: DC | PRN

## 2022-08-11 MED ORDER — ACETAMINOPHEN 500 MG PO TABS
1000.0000 mg | ORAL_TABLET | Freq: Once | ORAL | Status: DC
  Filled 2022-08-11: qty 2

## 2022-08-11 MED ORDER — LIDOCAINE HCL (PF) 2 % IJ SOLN
INTRAMUSCULAR | Status: AC
  Filled 2022-08-11: qty 5

## 2022-08-11 MED ORDER — ACETAMINOPHEN 500 MG PO TABS
1000.0000 mg | ORAL_TABLET | Freq: Two times a day (BID) | ORAL | Status: DC
  Administered 2022-08-11: 1000 mg via ORAL
  Filled 2022-08-11 (×2): qty 2

## 2022-08-11 MED ORDER — ACETAMINOPHEN 10 MG/ML IV SOLN
INTRAVENOUS | Status: AC
  Filled 2022-08-11: qty 100

## 2022-08-11 MED ORDER — POLYETHYLENE GLYCOL 3350 17 G PO PACK: 17.0000 g | PACK | Freq: Every day | ORAL | Status: DC | PRN

## 2022-08-11 MED ORDER — DEXAMETHASONE SODIUM PHOSPHATE 10 MG/ML IJ SOLN
INTRAMUSCULAR | Status: DC | PRN
  Administered 2022-08-11: 8 mg via INTRAVENOUS

## 2022-08-11 MED ORDER — HYDROMORPHONE HCL 2 MG PO TABS
1.0000 mg | ORAL_TABLET | ORAL | Status: DC | PRN
  Administered 2022-08-11 – 2022-08-12 (×4): 2 mg via ORAL
  Filled 2022-08-11 (×4): qty 1

## 2022-08-11 MED ORDER — CHLORHEXIDINE GLUCONATE 0.12 % MT SOLN
15.0000 mL | Freq: Once | OROMUCOSAL | Status: AC
  Administered 2022-08-11: 15 mL via OROMUCOSAL

## 2022-08-11 MED ORDER — POVIDONE-IODINE 10 % EX SWAB: 2.0000 | Freq: Once | CUTANEOUS | Status: DC

## 2022-08-11 MED ORDER — FENTANYL CITRATE (PF) 100 MCG/2ML IJ SOLN
INTRAMUSCULAR | Status: DC | PRN
  Administered 2022-08-11 (×4): 50 ug via INTRAVENOUS

## 2022-08-11 MED ORDER — ACETAMINOPHEN 500 MG PO TABS: 1000.0000 mg | ORAL_TABLET | Freq: Four times a day (QID) | ORAL | Status: DC

## 2022-08-11 SURGICAL SUPPLY — 54 items
ADH SKN CLS APL DERMABOND .7 (GAUZE/BANDAGES/DRESSINGS) ×1
APL PRP STRL LF DISP 70% ISPRP (MISCELLANEOUS) ×1
BAG COUNTER SPONGE SURGICOUNT (BAG) ×1 IMPLANT
BAG SPEC THK2 15X12 ZIP CLS (MISCELLANEOUS) ×1
BAG SPNG CNTER NS LX DISP (BAG) ×1
BAG ZIPLOCK 12X15 (MISCELLANEOUS) ×1 IMPLANT
BLADE SAG 18X100X1.27 (BLADE) ×2 IMPLANT
BLADE SURG SZ10 CARB STEEL (BLADE) ×2 IMPLANT
CHLORAPREP W/TINT 26 (MISCELLANEOUS) ×3 IMPLANT
CLSR STERI-STRIP ANTIMIC 1/2X4 (GAUZE/BANDAGES/DRESSINGS) ×2 IMPLANT
COVER PERINEAL POST (MISCELLANEOUS) ×3 IMPLANT
COVER SURGICAL LIGHT HANDLE (MISCELLANEOUS) ×2 IMPLANT
DERMABOND ADVANCED (GAUZE/BANDAGES/DRESSINGS) ×1
DERMABOND ADVANCED .7 DNX12 (GAUZE/BANDAGES/DRESSINGS) IMPLANT
DRAPE IMP U-DRAPE 54X76 (DRAPES) ×2 IMPLANT
DRAPE STERI IOBAN 125X83 (DRAPES) ×3 IMPLANT
DRAPE U-SHAPE 47X51 STRL (DRAPES) ×6 IMPLANT
DRSG MEPILEX POST OP 4X8 (GAUZE/BANDAGES/DRESSINGS) ×3 IMPLANT
ELECT REM PT RETURN 15FT ADLT (MISCELLANEOUS) ×3 IMPLANT
GLOVE BIO SURGEON STRL SZ 6.5 (GLOVE) ×2 IMPLANT
GLOVE BIO SURGEON STRL SZ7.5 (GLOVE) ×3 IMPLANT
GLOVE BIOGEL M 7.0 STRL (GLOVE) ×1 IMPLANT
GLOVE BIOGEL PI IND STRL 7.5 (GLOVE) ×2 IMPLANT
GLOVE BIOGEL PI IND STRL 8 (GLOVE) ×1 IMPLANT
GLOVE BIOGEL PI INDICATOR 7.5 (GLOVE) ×2
GLOVE BIOGEL PI INDICATOR 8 (GLOVE) ×1
GLOVE SURG SYN 7.5  E (GLOVE) ×2
GLOVE SURG SYN 7.5 E (GLOVE) ×1 IMPLANT
GLOVE SURG SYN 7.5 PF PI (GLOVE) ×2 IMPLANT
GOWN SPEC L4 XLG W/TWL (GOWN DISPOSABLE) ×2 IMPLANT
GOWN STRL REUS W/ TWL LRG LVL3 (GOWN DISPOSABLE) ×1 IMPLANT
GOWN STRL REUS W/TWL LRG LVL3 (GOWN DISPOSABLE) ×2
HEAD BIOLOX HIP 36/-5 (Joint) IMPLANT
HIP BIOLOX HD 36/-5 (Joint) ×2 IMPLANT
HOLDER FOLEY CATH W/STRAP (MISCELLANEOUS) IMPLANT
INSERT 0 DEGREE 36 (Miscellaneous) ×1 IMPLANT
KIT TURNOVER KIT A (KITS) ×1 IMPLANT
MANIFOLD NEPTUNE II (INSTRUMENTS) ×2 IMPLANT
NS IRRIG 1000ML POUR BTL (IV SOLUTION) ×3 IMPLANT
PACK ANTERIOR HIP CUSTOM (KITS) ×2 IMPLANT
PROTECTOR NERVE ULNAR (MISCELLANEOUS) ×3 IMPLANT
SCREW HEX LP 6.5X20 (Screw) ×1 IMPLANT
SHELL TRIDENT II CLUST 50 (Shell) ×1 IMPLANT
SPIKE FLUID TRANSFER (MISCELLANEOUS) ×6 IMPLANT
STEM HIP 4 127DEG (Stem) ×1 IMPLANT
STERI STRIP BROWN 1/4X3 R155 (GAUZE/BANDAGES/DRESSINGS) ×1 IMPLANT
SUT MNCRL AB 3-0 PS2 18 (SUTURE) ×3 IMPLANT
SUT VIC AB 0 CT1 36 (SUTURE) ×3 IMPLANT
SUT VIC AB 1 CT1 36 (SUTURE) ×3 IMPLANT
SUT VIC AB 2-0 CT1 27 (SUTURE) ×4
SUT VIC AB 2-0 CT1 TAPERPNT 27 (SUTURE) ×2 IMPLANT
TRAY FOLEY MTR SLVR 16FR STAT (SET/KITS/TRAYS/PACK) IMPLANT
TUBE SUCTION HIGH CAP CLEAR NV (SUCTIONS) ×3 IMPLANT
WATER STERILE IRR 1000ML POUR (IV SOLUTION) ×4 IMPLANT

## 2022-08-11 NOTE — Plan of Care (Signed)
  Problem: Nutrition: Goal: Adequate nutrition will be maintained Outcome: Progressing   

## 2022-08-11 NOTE — Op Note (Signed)
08/11/2022  9:02 AM  PATIENT:  Grace Carson   MRN: [id number]  PRE-OPERATIVE DIAGNOSIS:  oa right hip  POST-OPERATIVE DIAGNOSIS:  oa right hip  PROCEDURE:  Procedure(s): TOTAL HIP ARTHROPLASTY ANTERIOR APPROACH  PREOPERATIVE INDICATIONS:    Grace Carson is an 46 y.o. female who has a diagnosis of <principal problem not specified> and elected for surgical management after failing conservative treatment.  The risks benefits and alternatives were discussed with the patient including but not limited to the risks of nonoperative treatment, versus surgical intervention including infection, bleeding, nerve injury, periprosthetic fracture, the need for revision surgery, dislocation, leg length discrepancy, blood clots, cardiopulmonary complications, morbidity, mortality, among others, and they were willing to proceed.     OPERATIVE REPORT     SURGEON:   Grace Butters, MD    ASSISTANT:  Grace Moats, PA-C, he was present and scrubbed throughout the case, critical for completion in a timely fashion, and for retraction, instrumentation, and closure.     ANESTHESIA:  General    COMPLICATIONS:  None.     COMPONENTS:  [employer] acolade fit femur size 4 with a 36 mm -5 head ball and an acetabular shell size 50 with a  polyethylene liner    PROCEDURE IN DETAIL:   The patient was met in the holding area and  identified.  The appropriate hip was identified and marked at the operative site.  The patient was then transported to the OR  and  placed under anesthesia per that record.  At that point, the patient was  placed in the supine position and  secured to the operating room table and all bony prominences padded. He received pre-operative antibiotics    The operative lower extremity was prepped from the iliac crest to the distal leg.  Sterile draping was performed.  Time out was performed prior to incision.      Skin incision was made just 2 cm lateral to the ASIS  extending in line with  the tensor fascia lata. Electrocautery was used to control all bleeders. I dissected down sharply to the fascia of the tensor fascia lata was confirmed that the muscle fibers beneath were running posteriorly. I then incised the fascia over the superficial tensor fascia lata in line with the incision. The fascia was elevated off the anterior aspect of the muscle the muscle was retracted posteriorly and protected throughout the case. I then used electrocautery to incise the tensor fascia lata fascia control and all bleeders. Immediately visible was the fat over top of the anterior neck and capsule.  I removed the anterior fat from the capsule and elevated the rectus muscle off of the anterior capsule. I then removed a large time of capsule. The retractors were then placed over the anterior acetabulum as well as around the superior and inferior neck.  I then made a femoral neck cut. Then used the power corkscrew to remove the femoral head from the acetabulum and thoroughly irrigated the acetabulum. I sized the femoral head.    I then exposed the deep acetabulum, cleared out any tissue including the ligamentum teres.   After adequate visualization, I excised the labrum, and then sequentially reamed.  I then impacted the acetabular implant into place using fluoroscopy for guidance.  Appropriate version and inclination was confirmed clinically matching their bony anatomy, and with fluoroscopy.  I placed a 20 mm screw in the posterior/superio position with an excellent bite.    I then placed the polyethylene liner in  place  I then adducted the leg and released the external rotators from the posterior femur allowing it to be easily delivered up lateral and anterior to the acetabulum for preparation of the femoral canal.    I then prepared the proximal femur using the cookie-cutter and then sequentially reamed and broached.  A trial broach, neck, and head was utilized, and I reduced the hip and used  floroscopy to assess the neck length and femoral implant.  I then impacted the femoral prosthesis into place into the appropriate version. The hip was then reduced and fluoroscopy confirmed appropriate position. Leg lengths were restored.  I then irrigated the hip copiously again with, and repaired the fascia with Vicryl, followed by monocryl for the subcutaneous tissue, Monocryl for the skin, Steri-Strips and sterile gauze. The patient was then awakened and returned to PACU in stable and satisfactory condition. There were no complications.  POST OPERATIVE PLAN: WBAT, DVT px: SCD's/TED, ambulation and chemical dvt px  Grace Lynch, MD Orthopedic Surgeon [phone]

## 2022-08-11 NOTE — Progress Notes (Signed)
Updated Winter, spouse, that patient will be admitted for pain control and will go to room 1335.

## 2022-08-11 NOTE — Interval H&P Note (Signed)
History and Physical Interval Note:  08/11/2022 7:06 AM  Grace Carson  has presented today for surgery, with the diagnosis of oa right hip.  The various methods of treatment have been discussed with the patient and family. After consideration of risks, benefits and other options for treatment, the patient has consented to  Procedure(s): TOTAL HIP ARTHROPLASTY ANTERIOR APPROACH (Right) as a surgical intervention.  The patient's history has been reviewed, patient examined, no change in status, stable for surgery.  I have reviewed the patient's chart and labs.  Questions were answered to the patient's satisfaction.     Sheral Apley

## 2022-08-11 NOTE — Discharge Instructions (Signed)

## 2022-08-11 NOTE — Plan of Care (Signed)

## 2022-08-11 NOTE — Evaluation (Signed)
Physical Therapy Evaluation Patient Details Name: Grace Carson MRN: [id number] DOB: 1976/11/07 Today's Date: 08/11/2022  History of Present Illness  Pt is a 45yo female presenting s/p R-THA, AA on 08/11/22. PMH: bipolar, PTSD.  Clinical Impression  CHARLESE GRUETZMACHER is a 46 y.o. female POD 0 s/p R-THA, AA. Patient reports independence with mobility at baseline. Patient is now limited by functional impairments (see PT problem list below) and requires min assist for bed mobility and min guard for transfers. Patient was able to ambulate 10 feet with RW and min guard level of assist. Patient instructed in exercise to facilitate ROM and circulation to manage edema. Provided incentive spirometer and with Vcs pt able to achieve . Patient will benefit from continued skilled PT interventions to address impairments and progress towards PLOF. Acute PT will follow to progress mobility and stair training in preparation for safe discharge home.       Recommendations for follow up therapy are one component of a multi-disciplinary discharge planning process, led by the attending physician.  Recommendations may be updated based on patient status, additional functional criteria and insurance authorization.  Follow Up Recommendations Follow physician's recommendations for discharge plan and follow up therapies      Assistance Recommended at Discharge Set up Supervision/Assistance  Patient can return home with the following  A little help with walking and/or transfers;A little help with bathing/dressing/bathroom;Assistance with cooking/housework;Assist for transportation    Equipment Recommendations Rolling walker (2 wheels)  Recommendations for Other Services       Functional Status Assessment Patient has had a recent decline in their functional status and demonstrates the ability to make significant improvements in function in a reasonable and predictable amount of time.     Precautions /  Restrictions Precautions Precautions: Fall Restrictions Weight Bearing Restrictions: Yes RLE Weight Bearing: Weight bearing as tolerated      Mobility  Bed Mobility Overal bed mobility: Needs Assistance Bed Mobility: Supine to Sit     Supine to sit: Min assist     General bed mobility comments: Min assist to bring RLE off bed    Transfers Overall transfer level: Needs assistance Equipment used: Rolling walker (2 wheels) Transfers: Sit to/from Stand, Bed to chair/wheelchair/BSC Sit to Stand: Min guard           General transfer comment: For safety only, no physical assist required    Ambulation/Gait Ambulation/Gait assistance: Min guard, +2 safety/equipment Gait Distance (Feet): 10 Feet Assistive device: Rolling walker (2 wheels) Gait Pattern/deviations: Step-to pattern Gait velocity: decreased     General Gait Details: Pt ambulated 53ft with RW and min guard, +2 for recliner follow for safety, no physical assist or overt LOB noted. VCs for sequencing and proximity to device.  Stairs            Wheelchair Mobility    Modified Rankin (Stroke Patients Only)       Balance Overall balance assessment: Needs assistance Sitting-balance support: Feet supported, No upper extremity supported Sitting balance-Leahy Scale: Good     Standing balance support: During functional activity, Reliant on assistive device for balance, Bilateral upper extremity supported Standing balance-Leahy Scale: Poor                               Pertinent Vitals/Pain Pain Assessment Pain Assessment: 0-10 Pain Score: 10-Worst pain ever Pain Location: right hip Pain Descriptors / Indicators: Operative site guarding Pain Intervention(s): Limited activity within patient's  tolerance, Monitored during session, Repositioned, Ice applied    Home Living Family/patient expects to be discharged to:: Private residence Living Arrangements: Spouse/significant  other;Children Available Help at Discharge: Family;Available 24 hours/day Type of Home: House Home Access: Level entry       Home Layout: One level Home Equipment: BSC/3in1      Prior Function Prior Level of Function : Independent/Modified Independent             Mobility Comments: ind ADLs Comments: ind     Hand Dominance        Extremity/Trunk Assessment   Upper Extremity Assessment Upper Extremity Assessment: Overall WFL for tasks assessed    Lower Extremity Assessment Lower Extremity Assessment: RLE deficits/detail;LLE deficits/detail RLE Deficits / Details: MMT ank DF/PF 3+/5 RLE Sensation: WNL LLE Deficits / Details: MMT ank DF/PF 3+/5 LLE Sensation: WNL    Cervical / Trunk Assessment Cervical / Trunk Assessment: Normal  Communication   Communication: No difficulties  Cognition Arousal/Alertness: Awake/alert Behavior During Therapy: WFL for tasks assessed/performed Overall Cognitive Status: Within Functional Limits for tasks assessed                                          General Comments      Exercises Total Joint Exercises Ankle Circles/Pumps: AROM, Both, 10 reps   Assessment/Plan    PT Assessment Patient needs continued PT services  PT Problem List Decreased strength;Decreased range of motion;Decreased activity tolerance;Decreased balance;Decreased mobility;Decreased coordination;Pain;Decreased knowledge of use of DME       PT Treatment Interventions DME instruction;Gait training;Stair training;Functional mobility training;Therapeutic activities;Therapeutic exercise;Balance training;Neuromuscular re-education;Patient/family education    PT Goals (Current goals can be found in the Care Plan section)  Acute Rehab PT Goals Patient Stated Goal: walk without pain PT Goal Formulation: With patient Time For Goal Achievement: 08/18/22 Potential to Achieve Goals: Good    Frequency 7X/week     Co-evaluation                AM-PAC PT "6 Clicks" Mobility  Outcome Measure Help needed turning from your back to your side while in a flat bed without using bedrails?: A Little Help needed moving from lying on your back to sitting on the side of a flat bed without using bedrails?: A Little Help needed moving to and from a bed to a chair (including a wheelchair)?: A Little Help needed standing up from a chair using your arms (e.g., wheelchair or bedside chair)?: A Little Help needed to walk in hospital room?: A Little Help needed climbing 3-5 steps with a railing? : A Little 6 Click Score: 18    End of Session Equipment Utilized During Treatment: Gait belt Activity Tolerance: Patient tolerated treatment well;No increased pain Patient left: in chair;with call bell/phone within reach;with chair alarm set;with family/visitor present;with SCD's reapplied Nurse Communication: Mobility status PT Visit Diagnosis: Pain;Difficulty in walking, not elsewhere classified (R26.2) Pain - Right/Left: Right Pain - part of body: Hip    Time: 7628-3151 PT Time Calculation (min) (ACUTE ONLY): 17 min   Charges:   PT Evaluation $PT Eval Low Complexity: 1 Low          Jamesetta Geralds, PT, DPT [facility] Department Office: [phone] Pager: [phone]  Jamesetta Geralds 08/11/2022, 6:43 PM

## 2022-08-11 NOTE — Transfer of Care (Signed)
Immediate Anesthesia Transfer of Care Note  Patient: Grace Carson  Procedure(s) Performed: TOTAL HIP ARTHROPLASTY ANTERIOR APPROACH (Right: Hip)  Patient Location: PACU  Anesthesia Type:Spinal  Level of Consciousness: drowsy  Airway & Oxygen Therapy: Patient Spontanous Breathing and Patient connected to face mask oxygen  Post-op Assessment: Report given to RN and Post -op Vital signs reviewed and stable  Post vital signs: Reviewed and stable  Last Vitals:  Vitals Value Taken Time  BP 129/83 08/11/22 0934  Temp    Pulse 86 08/11/22 0935  Resp 18 08/11/22 0935  SpO2 95 % 08/11/22 0935  Vitals shown include unvalidated device data.  Last Pain:  Vitals:   08/11/22 0629  TempSrc:   PainSc: 7          Complications: No notable events documented.

## 2022-08-11 NOTE — Anesthesia Procedure Notes (Signed)
Spinal  Patient location during procedure: OR Start time: 08/11/2022 7:19 AM End time: 08/11/2022 7:22 AM Reason for block: surgical anesthesia Staffing Performed: resident/CRNA  Anesthesiologist: Bethena Midget, MD Resident/CRNA: Yolonda Kida, CRNA Performed by: Yolonda Kida, CRNA Authorized by: Bethena Midget, MD   Preanesthetic Checklist Completed: patient identified, IV checked, site marked, risks and benefits discussed, surgical consent, monitors and equipment checked, pre-op evaluation and timeout performed Spinal Block Patient position: sitting Prep: DuraPrep Patient monitoring: blood pressure, continuous pulse ox and heart rate Approach: midline Location: L3-4 Injection technique: single-shot Needle Needle type: Pencan  Needle gauge: 24 G Assessment Sensory level: T6 Events: CSF return

## 2022-08-12 ENCOUNTER — Encounter (HOSPITAL_COMMUNITY): Payer: Self-pay | Admitting: Orthopedic Surgery

## 2022-08-12 DIAGNOSIS — M1611 Unilateral primary osteoarthritis, right hip: Secondary | ICD-10-CM | POA: Diagnosis not present

## 2022-08-12 MED ORDER — OXYCODONE HCL 5 MG PO TABS
10.0000 mg | ORAL_TABLET | Freq: Every day | ORAL | Status: DC
  Administered 2022-08-12 (×2): 10 mg via ORAL
  Filled 2022-08-12 (×2): qty 2

## 2022-08-12 MED ORDER — OXYCODONE HCL 5 MG PO TABS: 5.0000 mg | ORAL_TABLET | Freq: Three times a day (TID) | ORAL | Status: DC | PRN

## 2022-08-12 MED ORDER — ACETAMINOPHEN 325 MG PO TABS
325.0000 mg | ORAL_TABLET | Freq: Every day | ORAL | Status: DC
  Administered 2022-08-12 (×2): 325 mg via ORAL
  Filled 2022-08-12 (×2): qty 1

## 2022-08-12 NOTE — Progress Notes (Signed)
    Subjective: Patient reports pain as marked. Had a rough day yesterday with pain management. Tolerating diet.  Urinating.   No CP, SOB.  Has mobilized OOB with PT/OT.   Objective:   VITALS:   Vitals:   08/11/22 1830 08/11/22 2115 08/12/22 0153 08/12/22 0511  BP: (!) 149/74 133/82 138/78 126/76  Pulse: 71 65 79 74  Resp: 18 17 17 17   Temp: 97.6 F (36.4 C) 98.3 F (36.8 C) 98.5 F (36.9 C) 98.2 F (36.8 C)  TempSrc: Oral Oral Oral Oral  SpO2: 99% 98% 98% 97%  Weight:      Height:          Latest Ref Rng & Units 07/31/2022   11:25 AM 06/10/2022    5:17 PM 09/24/2011    4:14 PM  CBC  WBC 4.0 - 10.5 K/uL 3.8  8.0  6.4   Hemoglobin 12.0 - 15.0 g/dL 09/26/2011  89.2  11.9   Hematocrit 36.0 - 46.0 % 41.8  38.6  39.4   Platelets 150 - 400 K/uL 266  287  289       Latest Ref Rng & Units 06/10/2022    5:17 PM 09/24/2011    4:14 PM 03/11/2011    1:24 PM  BMP  Glucose 70 - 99 mg/dL 03/13/2011  408  81   BUN 6 - 20 mg/dL 16  10  10    Creatinine 0.44 - 1.00 mg/dL 144   8.18   Sodium 135 - 145 mmol/L 137  139  139   Potassium 3.5 - 5.1 mmol/L 3.4  4.3  3.7   Chloride 98 - 111 mmol/L 114  105  107   CO2 22 - 32 mmol/L 19  27  25    Calcium 8.9 - 10.3 mg/dL 8.9  5.63  8.7    Intake/Output      08/15 0701 08/16 0700 08/16 0701 08/17 0700   P.O. 760    I.V. (mL/kg) 1900 (19.6)    IV Piggyback 600    Total Intake(mL/kg) 3260 (33.6)    Urine (mL/kg/hr) 3700 (1.6)    Blood 350    Total Output 4050    Net -790            Physical Exam: General: NAD.  Laying in bed, calm, comfortable Resp: No increased wob Cardio: regular rate and rhythm ABD soft Neurologically intact MSK Neurovascularly intact Sensation intact distally Intact pulses distally Dorsiflexion/Plantar flexion intact Incision: dressing C/D/I   Assessment: 1 Day Post-Op  S/P Procedure(s) (LRB): TOTAL HIP ARTHROPLASTY ANTERIOR APPROACH (Right) by Dr. 9/16. 9/16 on 08/11/22  Principal Problem:   S/P  total hip arthroplasty   Plan: Work on better pain management  Advance diet Up with therapy Incentive Spirometry Elevate and Apply ice  Weightbearing: WBAT RLE Insicional and dressing care: Dressings left intact until follow-up and Reinforce dressings as needed Orthopedic device(s): None Showering: Keep dressing dry VTE prophylaxis: Aspirin 81mg  BID  x 30 days post-op , SCDs, ambulation Pain control: Tylenol, Oxycodone 10mg  q5h for normal daily chronic pain dose, anything given on top of that is for pain not managed by that, must give that first before giving excess Oxy or Dilaudid Follow - up plan: 2 weeks Contact information:  Jewel Baize MD, Eulah Pont PA-C  Dispo: Home today     08/13/22, Office 08/12/2022, 8:48 AM

## 2022-08-12 NOTE — Therapy (Signed)
OUTPATIENT PHYSICAL THERAPY LOWER EXTREMITY EVALUATION   Patient Name: Grace Carson MRN: [id number] DOB:[birth year], 45 y.o., female Today's Date: 08/13/2022   PT End of Session - 08/13/22 0932     Visit Number 1    Number of Visits 17    Date for PT Re-Evaluation 10/10/22    Authorization Type MCD    PT Start Time 0932    PT Stop Time 1009    PT Time Calculation (min) 37 min    Equipment Utilized During Treatment Other (comment)   RW   Activity Tolerance Patient tolerated treatment well    Behavior During Therapy WFL for tasks assessed/performed             Past Medical History:  Diagnosis Date   Arthritis    Bipolar 1 disorder (HCC)    Depression    Post traumatic stress disorder (PTSD)    Social anxiety disorder    Past Surgical History:  Procedure Laterality Date   CESAREAN SECTION     TONSILLECTOMY     TOOTH EXTRACTION     TOTAL HIP ARTHROPLASTY Right 08/11/2022   Procedure: TOTAL HIP ARTHROPLASTY ANTERIOR APPROACH;  Surgeon: Sheral Apley, MD;  Location: [facility];  Service: Orthopedics;  Laterality: Right;   Patient Active Problem List   Diagnosis Date Noted   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 She is having difficulty moving the leg especially getting out of the bed. She has f/u with surgeon on 08/26/22 but was instructed to keep bandages in place until this appointment. Prior to this surgery she was using a SPC.   PERTINENT HISTORY: Rt THA  08/11/22 Arthritis Bipolar PTSD Social anxiety disorder   PAIN:  Are you having pain? Yes: NPRS scale: 5/10 Pain location: anterolateral Rt hip Pain description: stinging, burning, ache Aggravating factors: prolonged standing, sitting Relieving factors: medication and pillow support  PRECAUTIONS: Anterior hip  WEIGHT BEARING RESTRICTIONS No  FALLS:  Has patient fallen in last 6 months? No  LIVING ENVIRONMENT: Lives with: lives with their family Lives in: House/apartment Stairs: Yes: External: 2 steps; none Has following equipment at home: Single point cane and Walker - 2 wheeled  OCCUPATION: disability   PLOF: Independent  PATIENT GOALS "I want to walk with no assistance and put my own shoes and socks on"    OBJECTIVE:   DIAGNOSTIC FINDINGS:  IMPRESSION: Intraoperative fluoroscopic images of the right hip demonstrate total arthroplasty. No obvious perihardware fracture or component malpositioning.  PATIENT SURVEYS:  LEFS 13/80  COGNITION:  Overall cognitive status: Within functional limits for tasks assessed     SENSATION: Not tested  EDEMA:  N/A   POSTURE: rounded shoulders, forward head, and flexed trunk   PALPATION: Not assessed   LOWER EXTREMITY ROM:  Active ROM Right eval Left eval  Hip flexion 80   Hip extension    Hip abduction    Hip adduction    Hip internal rotation    Hip external rotation    Knee  flexion    Knee extension    Ankle dorsiflexion    Ankle plantarflexion    Ankle inversion    Ankle eversion     (Blank rows = not tested)  LOWER EXTREMITY MMT:  MMT Right eval Left eval  Hip flexion 3- 4+  Hip extension    Hip abduction    Hip adduction    Hip internal rotation    Hip external rotation    Knee flexion 4+ 5  Knee extension 4 5  Ankle dorsiflexion    Ankle plantarflexion    Ankle inversion    Ankle eversion     (Blank rows = not tested)  Majority of Rt hip MMT deferred secondary to post-op acuity     FUNCTIONAL TESTS:  5 times sit to stand: 16.3 seconds Timed up and go (TUG): 22 seconds  GAIT: Distance walked: 10 ft Assistive device utilized: Environmental consultant - 2 wheeled Level of assistance: Modified independence Comments: forward flexed posture, decreased hip extension RLE, decreased push-off     TODAY'S TREATMENT: [facility] Adult PT Treatment:                                                DATE: 08/13/22 Therapeutic Exercise: Demonstrated and issue initial HEP.   Therapeutic Activity: Education on assessment findings that will be addressed throughout duration of POC.      PATIENT EDUCATION:  Education details: see treatment  Person educated: Patient and partner Education method: Explanation, Demonstration, Tactile cues, Verbal cues, and Handouts Education comprehension: verbalized understanding, returned demonstration, verbal cues required, tactile cues required, and needs further education   HOME EXERCISE PROGRAM: Access Code: [email or web address] URL: [email or web address] Date: 08/13/2022 Prepared by: Letitia Libra  Exercises - Supine Quad Set  - 2 x daily - 7 x weekly - 2 sets - 10 reps - 5 sec  hold - Seated Long Arc Quad  - 2 x daily - 7 x weekly - 2 sets - 10 reps - 5 sec hold - Long Sitting Calf Stretch with Strap  - 2 x daily - 7 x weekly - 3 sets - 30 sec  hold - Supine Heel Slide  - 1 x daily - 7 x weekly - 1 sets - 10 reps - 5 sec hold - Sit to Stand  - 2 x daily - 7 x weekly - 1 sets - 5 reps  ASSESSMENT:  CLINICAL IMPRESSION: Patient is a 46 y.o. female who was seen today for physical therapy evaluation and treatment for s/p Rt THA on 08/11/22. She demonstrates ROM, strength, gait and balance deficits that are consistent with her recent post-operative status. She will benefit from skilled PT to address the above stated deficits in order to return to optimal function     OBJECTIVE IMPAIRMENTS Abnormal gait, decreased activity tolerance, decreased balance,  decreased mobility, difficulty walking, decreased ROM, decreased strength, improper body mechanics, postural dysfunction, and pain.   ACTIVITY LIMITATIONS carrying, lifting, bending, sitting, standing, squatting, sleeping, stairs, transfers, bed mobility, and locomotion level  PARTICIPATION LIMITATIONS: meal prep, cleaning, laundry, driving, shopping, community activity, and yard work  PERSONAL FACTORS Age and Fitness are also affecting patient's functional outcome.   REHAB POTENTIAL: Good  CLINICAL DECISION MAKING: Stable/uncomplicated  EVALUATION COMPLEXITY: Low   GOALS: Goals reviewed with patient? Yes  SHORT TERM GOALS: Target date:09/10/22 Patient will be independent and  compliant with initial HEP.  Baseline: issued at eval  Goal status: INITIAL  2.  Patient will demonstrate at least 4-/5 Rt hip flexor strength to improve ease of getting in and out of her bed.  Baseline: see above  Goal status: INITIAL  3.  Patient will demonstrate at least 90 degrees of Rt hip flexion AROM to improve ability to don/doff shoes and socks. Baseline: see above Goal status: INITIAL   LONG TERM GOALS: Target date: 10/08/22  Patient will complete TUG in </= 12 seconds to reduce her fall risk.  Baseline: see above  Goal status: INITIAL  2.  Patient will complete 5 x STS in </= 12 seconds to improve ease of transfers Baseline: see above  Goal status: INITIAL  3.  Patient will ambulate household and community distance with LRAD Baseline: currently using RW; previously using SPC  Goal status: INITIAL  4.  Patient will score at least 40/80 on the LEFS to signify clinically meaningful improvement in functional abilities.  Baseline: 13/80 Goal status: INITIAL    PLAN: PT FREQUENCY: 2x/week  PT DURATION: 8 weeks  PLANNED INTERVENTIONS: Therapeutic exercises, Therapeutic activity, Neuromuscular re-education, Balance training, Gait training, Patient/Family education, Self Care, Stair  training, DME instructions, Dry Needling, Electrical stimulation, Cryotherapy, Moist heat, Taping, Manual therapy, and Re-evaluation  PLAN FOR NEXT SESSION: review HEP, manual to hip, progress strengthening as tolerated   Letitia Libra, PT, DPT, ATC 08/13/22 1:49 PM

## 2022-08-12 NOTE — Progress Notes (Signed)
Physical Therapy Treatment Patient Details Name: Grace Carson MRN: [id number] DOB: August 20, 1976 Today's Date: 08/12/2022   History of Present Illness Pt is a 46yo female presenting s/p R-THA, AA on 08/11/22. PMH: bipolar, PTSD.    PT Comments    Pt is progressing well with PT, she ambulated 160' with RW, no loss of balance. Stair training completed. Pt demonstrates good understanding of HEP. She is ready to DC home from a PT standpoint.    Recommendations for follow up therapy are one component of a multi-disciplinary discharge planning process, led by the attending physician.  Recommendations may be updated based on patient status, additional functional criteria and insurance authorization.  Follow Up Recommendations  Follow physician's recommendations for discharge plan and follow up therapies     Assistance Recommended at Discharge Set up Supervision/Assistance  Patient can return home with the following A little help with walking and/or transfers;A little help with bathing/dressing/bathroom;Assistance with cooking/housework;Assist for transportation;Help with stairs or ramp for entrance   Equipment Recommendations  Rolling walker (2 wheels)    Recommendations for Other Services       Precautions / Restrictions Precautions Precautions: Fall Restrictions Weight Bearing Restrictions: Yes RLE Weight Bearing: Weight bearing as tolerated     Mobility  Bed Mobility   Bed Mobility: Sit to Supine       Sit to supine: Min assist   General bed mobility comments: Min assist to bring RLE into bed    Transfers Overall transfer level: Needs assistance Equipment used: Rolling walker (2 wheels) Transfers: Sit to/from Stand Sit to Stand: Min guard           General transfer comment: For safety only, no physical assist required, VCs hand placement    Ambulation/Gait Ambulation/Gait assistance: Supervision Gait Distance (Feet): 160 Feet Assistive device: Rolling walker  (2 wheels) Gait Pattern/deviations: Step-to pattern Gait velocity: decreased     General Gait Details: VCs sequencing, steady, no loss of balance   Stairs Stairs: Yes Stairs assistance: Min assist Stair Management: No rails, Backwards, Forwards, With walker, Step to pattern Number of Stairs: 1 General stair comments: instructed pt in both forwards and backwards technique   Wheelchair Mobility    Modified Rankin (Stroke Patients Only)       Balance Overall balance assessment: Needs assistance Sitting-balance support: Feet supported, No upper extremity supported Sitting balance-Leahy Scale: Good     Standing balance support: During functional activity, Reliant on assistive device for balance, Bilateral upper extremity supported Standing balance-Leahy Scale: Poor                              Cognition Arousal/Alertness: Awake/alert Behavior During Therapy: WFL for tasks assessed/performed Overall Cognitive Status: Within Functional Limits for tasks assessed                                          Exercises Total Joint Exercises Ankle Circles/Pumps: AROM, Both, 10 reps Quad Sets: AROM, Right, 5 reps, Supine Short Arc Quad: AROM, Right, 10 reps, Supine Heel Slides: AAROM, Right, 10 reps, Supine Hip ABduction/ADduction: AAROM, Right, 10 reps, Supine Long Arc Quad: AROM, Right, 10 reps, Seated    General Comments        Pertinent Vitals/Pain Pain Assessment Pain Score: 9  Pain Location: right hip Pain Descriptors / Indicators: Burning Pain Intervention(s): Limited activity  within patient's tolerance, Monitored during session, Premedicated before session, Ice applied, Repositioned    Home Living                          Prior Function            PT Goals (current goals can now be found in the care plan section) Acute Rehab PT Goals Patient Stated Goal: walk without pain PT Goal Formulation: With patient Time For  Goal Achievement: 08/18/22 Potential to Achieve Goals: Good Progress towards PT goals: Progressing toward goals    Frequency    7X/week      PT Plan Current plan remains appropriate    Co-evaluation              AM-PAC PT "6 Clicks" Mobility   Outcome Measure  Help needed turning from your back to your side while in a flat bed without using bedrails?: A Little Help needed moving from lying on your back to sitting on the side of a flat bed without using bedrails?: A Little Help needed moving to and from a bed to a chair (including a wheelchair)?: A Little Help needed standing up from a chair using your arms (e.g., wheelchair or bedside chair)?: A Little Help needed to walk in hospital room?: A Little Help needed climbing 3-5 steps with a railing? : A Little 6 Click Score: 18    End of Session Equipment Utilized During Treatment: Gait belt Activity Tolerance: Patient tolerated treatment well;Patient limited by pain Patient left: in chair;with call bell/phone within reach;with chair alarm set;with family/visitor present Nurse Communication: Mobility status PT Visit Diagnosis: Pain;Difficulty in walking, not elsewhere classified (R26.2) Pain - Right/Left: Right Pain - part of body: Hip     Time: 7371-0626 PT Time Calculation (min) (ACUTE ONLY): 31 min  Charges:  $Gait Training: 8-22 mins $Therapeutic Exercise: 8-22 mins                     Ralene Bathe Kistler PT 08/12/2022  Acute Rehabilitation Services  Office [phone]

## 2022-08-12 NOTE — Anesthesia Postprocedure Evaluation (Signed)
Anesthesia Post Note  Patient: Grace Carson  Procedure(s) Performed: TOTAL HIP ARTHROPLASTY ANTERIOR APPROACH (Right: Hip)     Patient location during evaluation: PACU Anesthesia Type: MAC and Spinal Level of consciousness: awake and alert Pain management: pain level controlled Vital Signs Assessment: post-procedure vital signs reviewed and stable Respiratory status: spontaneous breathing, nonlabored ventilation, respiratory function stable and patient connected to nasal cannula oxygen Cardiovascular status: stable and blood pressure returned to baseline Postop Assessment: no apparent nausea or vomiting Anesthetic complications: no   No notable events documented.  Last Vitals:  Vitals:   08/12/22 0511 08/12/22 1044  BP: 126/76 127/73  Pulse: 74 66  Resp: 17 18  Temp: 36.8 C 36.7 C  SpO2: 97% 97%    Last Pain:  Vitals:   08/12/22 1044  TempSrc: Oral  PainSc:    Pain Goal: Patients Stated Pain Goal: 6 (08/11/22 1017)                 Nikki Rusnak

## 2022-08-12 NOTE — Plan of Care (Signed)
  Problem: Activity: Goal: Ability to avoid complications of mobility impairment will improve Outcome: Progressing Goal: Ability to tolerate increased activity will improve Outcome: Progressing   Problem: Pain Management: Goal: Pain level will decrease with appropriate interventions Outcome: Progressing   Problem: Activity: Goal: Risk for activity intolerance will decrease Outcome: Progressing   

## 2022-08-12 NOTE — Discharge Summary (Signed)
Physician Discharge Summary  Patient ID: Grace Carson MRN: [id number] DOB/AGE: 44-Jan-1977 41 y.o.  Admit date: 08/11/2022 Discharge date: 08/12/2022  Admission Diagnoses: right hip OA  Discharge Diagnoses:  Principal Problem:   S/P total hip arthroplasty   Discharged Condition: fair  Hospital Course: Patient underwent a right total hip arthroplasty by Dr. Eulah Pont on 08/11/22 without complications. She spent the night in observation for pain control and mobilization. She has passed her PT evaluation and is ready for discharge home.  Consults: None  Significant Diagnostic Studies: n/a  Treatments: IV hydration, antibiotics: Ancef, analgesia: acetaminophen, Dilaudid, Tramadol, and Oxycodone, anticoagulation: ASA, therapies: PT and OT, and surgery: right THA  Discharge Exam: Blood pressure 127/73, pulse 66, temperature 98 F (36.7 C), temperature source Oral, resp. rate 18, height  (1.676 m), weight 97 kg, last menstrual period 07/03/2014, SpO2 97 %. General appearance: alert, cooperative, and no distress Head: Normocephalic, without obvious abnormality, atraumatic Resp: clear to auscultation bilaterally Cardio: regular rate and rhythm, S1, S2 normal, no murmur, click, rub or gallop GI: soft, non-tender; bowel sounds normal; no masses,  no organomegaly Extremities: extremities normal, atraumatic, no cyanosis or edema Pulses:  L brachial 2+ R brachial 2+  L radial 2+ R radial 2+  L inguinal 2+ R inguinal 2+  L popliteal 2+ R popliteal 2+  L posterior tibial 2+ R posterior tibial 2+  L dorsalis pedis 2+ R dorsalis pedis 2+   Neurologic: Grossly normal Incision/Wound: c/d/i  Disposition: Discharge disposition: 01-Home or Self Care       Discharge Instructions     Call MD / Call 911   Complete by: As directed    If you experience chest pain or shortness of breath, CALL 911 and be transported to the hospital emergency room.  If you develope a fever above 101 F, pus  (white drainage) or increased drainage or redness at the wound, or calf pain, call your surgeon's office.   Call MD / Call 911   Complete by: As directed    If you experience chest pain or shortness of breath, CALL 911 and be transported to the hospital emergency room.  If you develope a fever above 101 F, pus (white drainage) or increased drainage or redness at the wound, or calf pain, call your surgeon's office.   Diet - low sodium heart healthy   Complete by: As directed    Diet - low sodium heart healthy   Complete by: As directed    Discharge instructions   Complete by: As directed    You may bear weight as tolerated. Keep your dressing on and dry until follow up. Take medicine to prevent blood clots as directed. Take pain medicine as needed with the goal of transitioning to over the counter medicines.    INSTRUCTIONS AFTER JOINT REPLACEMENT   Remove items at home which could result in a fall. This includes throw rugs or furniture in walking pathways ICE to the affected joint every three hours while awake for 30 minutes at a time, for at least the first 3-5 days, and then as needed for pain and swelling.  Continue to use ice for pain and swelling. You may notice swelling that will progress down to the foot and ankle.  This is normal after surgery.  Elevate your leg when you are not up walking on it.   Continue to use the breathing machine you got in the hospital (incentive spirometer) which will help keep your temperature down.  It is common for your temperature to cycle up and down following surgery, especially at night when you are not up moving around and exerting yourself.  The breathing machine keeps your lungs expanded and your temperature down.   DIET:  As you were doing prior to hospitalization, we recommend a well-balanced diet.  DRESSING / WOUND CARE / SHOWERING  You may shower 3 days after surgery, but keep the wounds dry during showering.  You may use an occlusive plastic  wrap (Press'n Seal for example) with blue painter's tape at edges, NO SOAKING/SUBMERGING IN THE BATHTUB.  If the bandage gets wet, call the office.   ACTIVITY  Increase activity slowly as tolerated, but follow the weight bearing instructions below.   No driving for 6 weeks or until further direction given by your physician.  You cannot drive while taking narcotics.  No lifting or carrying greater than 10 lbs. until further directed by your surgeon. Avoid periods of inactivity such as sitting longer than an hour when not asleep. This helps prevent blood clots.  You may return to work once you are authorized by your doctor.    WEIGHT BEARING   Weight bearing as tolerated with assist device (walker, cane, etc) as directed, use it as long as suggested by your surgeon or therapist, typically at least 4-6 weeks.   EXERCISES  Results after joint replacement surgery are often greatly improved when you follow the exercise, range of motion and muscle strengthening exercises prescribed by your doctor. Safety measures are also important to protect the joint from further injury. Any time any of these exercises cause you to have increased pain or swelling, decrease what you are doing until you are comfortable again and then slowly increase them. If you have problems or questions, call your caregiver or physical therapist for advice.   Rehabilitation is important following a joint replacement. After just a few days of immobilization, the muscles of the leg can become weakened and shrink (atrophy).  These exercises are designed to build up the tone and strength of the thigh and leg muscles and to improve motion. Often times heat used for twenty to thirty minutes before working out will loosen up your tissues and help with improving the range of motion but do not use heat for the first two weeks following surgery (sometimes heat can increase post-operative swelling).   These exercises can be done on a training  (exercise) mat, on the floor, on a table or on a bed. Use whatever works the best and is most comfortable for you.    Use music or television while you are exercising so that the exercises are a pleasant break in your day. This will make your life better with the exercises acting as a break in your routine that you can look forward to.   Perform all exercises about fifteen times, three times per day or as directed.  You should exercise both the operative leg and the other leg as well.  Exercises include:   Quad Sets - Tighten up the muscle on the front of the thigh (Quad) and hold for 5-10 seconds.   Straight Leg Raises - With your knee straight (if you were given a brace, keep it on), lift the leg to 60 degrees, hold for 3 seconds, and slowly lower the leg.  Perform this exercise against resistance later as your leg gets stronger.  Leg Slides: Lying on your back, slowly slide your foot toward your buttocks, bending your knee up  off the floor (only go as far as is comfortable). Then slowly slide your foot back down until your leg is flat on the floor again.  Angel Wings: Lying on your back spread your legs to the side as far apart as you can without causing discomfort.  Hamstring Strength:  Lying on your back, push your heel against the floor with your leg straight by tightening up the muscles of your buttocks.  Repeat, but this time bend your knee to a comfortable angle, and push your heel against the floor.  You may put a pillow under the heel to make it more comfortable if necessary.   A rehabilitation program following joint replacement surgery can speed recovery and prevent re-injury in the future due to weakened muscles. Contact your doctor or a physical therapist for more information on knee rehabilitation.    CONSTIPATION  Constipation is defined medically as fewer than three stools per week and severe constipation as less than one stool per week.  Even if you have a regular bowel pattern at  home, your normal regimen is likely to be disrupted due to multiple reasons following surgery.  Combination of anesthesia, postoperative narcotics, change in appetite and fluid intake all can affect your bowels.   YOU MUST use at least one of the following options; they are listed in order of increasing strength to get the job done.  They are all available over the counter, and you may need to use some, POSSIBLY even all of these options:    Drink plenty of fluids (prune juice may be helpful) and high fiber foods Colace 100 mg by mouth twice a day  Senokot for constipation as directed and as needed Dulcolax (bisacodyl), take with full glass of water  Miralax (polyethylene glycol) once or twice a day as needed.  If you have tried all these things and are unable to have a bowel movement in the first 3-4 days after surgery call either your surgeon or your primary doctor.    If you experience loose stools or diarrhea, hold the medications until you stool forms back up.  If your symptoms do not get better within 1 week or if they get worse, check with your doctor.  If you experience "the worst abdominal pain ever" or develop nausea or vomiting, please contact the office immediately for further recommendations for treatment.   ITCHING:  If you experience itching with your medications, try taking only a single pain pill, or even half a pain pill at a time.  You can also use Benadryl over the counter for itching or also to help with sleep.   TED HOSE STOCKINGS:  Use stockings on both legs until for at least 2 weeks or as directed by physician office. They may be removed at night for sleeping.  MEDICATIONS:  See your medication summary on the "After Visit Summary" that nursing will review with you.  You may have some home medications which will be placed on hold until you complete the course of blood thinner medication.  It is important for you to complete the blood thinner medication as prescribed.  Take  medicines as prescribed.   You have several different medicines that work in different ways. - Tylenol is for mild to moderate pain. Try to take this medicine before turning to your narcotic medicines.  - Celebrex is to reduce pain / inflammation - Robaxin is for muscle spasms. This medicine can make you drowsy. - Oxycodone is a narcotic pain medicine.  Take this for severe pain not resolved by your normal daily dose of Percocet. This medicine can be dehydrating / constipating. - Zofran is for nausea and vomiting. - Colace is for constipation prevention  - Aspirin is to prevent blood clots after surgery. YOU MUST TAKE THIS MEDICINE!  PRECAUTIONS:  If you experience chest pain or shortness of breath - call 911 immediately for transfer to the hospital emergency department.   If you develop a fever greater that 101 F, purulent drainage from wound, increased redness or drainage from wound, foul odor from the wound/dressing, or calf pain - CONTACT YOUR SURGEON.                                                   FOLLOW-UP APPOINTMENTS:  If you do not already have a post-op appointment, please call the office [phone] for an appointment to be seen by Dr. Eulah Pont in 2 weeks.   OTHER INSTRUCTIONS:   MAKE SURE YOU:  Understand these instructions.  Get help right away if you are not doing well or get worse.    Thank you for letting us be a part of your medical care team.  It is a privilege we respect greatly.  We hope these instructions will help you stay on track for a fast and full recovery!   Discharge instructions   Complete by: As directed    You may bear weight as tolerated. Keep your dressing on and dry until follow up. Take medicine to prevent blood clots as directed. Take pain medicine as needed with the goal of transitioning to over the counter medicines.    INSTRUCTIONS AFTER JOINT REPLACEMENT   Remove items at home which could result in a fall. This includes throw rugs or  furniture in walking pathways ICE to the affected joint every three hours while awake for 30 minutes at a time, for at least the first 3-5 days, and then as needed for pain and swelling.  Continue to use ice for pain and swelling. You may notice swelling that will progress down to the foot and ankle.  This is normal after surgery.  Elevate your leg when you are not up walking on it.   Continue to use the breathing machine you got in the hospital (incentive spirometer) which will help keep your temperature down.  It is common for your temperature to cycle up and down following surgery, especially at night when you are not up moving around and exerting yourself.  The breathing machine keeps your lungs expanded and your temperature down.   DIET:  As you were doing prior to hospitalization, we recommend a well-balanced diet.  DRESSING / WOUND CARE / SHOWERING  You may shower 3 days after surgery, but keep the wounds dry during showering.  You may use an occlusive plastic wrap (Press'n Seal for example) with blue painter's tape at edges, NO SOAKING/SUBMERGING IN THE BATHTUB.  If the bandage gets wet, call the office.   ACTIVITY  Increase activity slowly as tolerated, but follow the weight bearing instructions below.   No driving for 6 weeks or until further direction given by your physician.  You cannot drive while taking narcotics.  No lifting or carrying greater than 10 lbs. until further directed by your surgeon. Avoid periods of inactivity such as sitting longer than an hour when not  asleep. This helps prevent blood clots.  You may return to work once you are authorized by your doctor.    WEIGHT BEARING   Weight bearing as tolerated with assist device (walker, cane, etc) as directed, use it as long as suggested by your surgeon or therapist, typically at least 4-6 weeks.   EXERCISES  Results after joint replacement surgery are often greatly improved when you follow the exercise, range of  motion and muscle strengthening exercises prescribed by your doctor. Safety measures are also important to protect the joint from further injury. Any time any of these exercises cause you to have increased pain or swelling, decrease what you are doing until you are comfortable again and then slowly increase them. If you have problems or questions, call your caregiver or physical therapist for advice.   Rehabilitation is important following a joint replacement. After just a few days of immobilization, the muscles of the leg can become weakened and shrink (atrophy).  These exercises are designed to build up the tone and strength of the thigh and leg muscles and to improve motion. Often times heat used for twenty to thirty minutes before working out will loosen up your tissues and help with improving the range of motion but do not use heat for the first two weeks following surgery (sometimes heat can increase post-operative swelling).   These exercises can be done on a training (exercise) mat, on the floor, on a table or on a bed. Use whatever works the best and is most comfortable for you.    Use music or television while you are exercising so that the exercises are a pleasant break in your day. This will make your life better with the exercises acting as a break in your routine that you can look forward to.   Perform all exercises about fifteen times, three times per day or as directed.  You should exercise both the operative leg and the other leg as well.  Exercises include:   Quad Sets - Tighten up the muscle on the front of the thigh (Quad) and hold for 5-10 seconds.   Straight Leg Raises - With your knee straight (if you were given a brace, keep it on), lift the leg to 60 degrees, hold for 3 seconds, and slowly lower the leg.  Perform this exercise against resistance later as your leg gets stronger.  Leg Slides: Lying on your back, slowly slide your foot toward your buttocks, bending your knee up off  the floor (only go as far as is comfortable). Then slowly slide your foot back down until your leg is flat on the floor again.  Angel Wings: Lying on your back spread your legs to the side as far apart as you can without causing discomfort.  Hamstring Strength:  Lying on your back, push your heel against the floor with your leg straight by tightening up the muscles of your buttocks.  Repeat, but this time bend your knee to a comfortable angle, and push your heel against the floor.  You may put a pillow under the heel to make it more comfortable if necessary.   A rehabilitation program following joint replacement surgery can speed recovery and prevent re-injury in the future due to weakened muscles. Contact your doctor or a physical therapist for more information on knee rehabilitation.    CONSTIPATION  Constipation is defined medically as fewer than three stools per week and severe constipation as less than one stool per week.  Even if you  have a regular bowel pattern at home, your normal regimen is likely to be disrupted due to multiple reasons following surgery.  Combination of anesthesia, postoperative narcotics, change in appetite and fluid intake all can affect your bowels.   YOU MUST use at least one of the following options; they are listed in order of increasing strength to get the job done.  They are all available over the counter, and you may need to use some, POSSIBLY even all of these options:    Drink plenty of fluids (prune juice may be helpful) and high fiber foods Colace 100 mg by mouth twice a day  Senokot for constipation as directed and as needed Dulcolax (bisacodyl), take with full glass of water  Miralax (polyethylene glycol) once or twice a day as needed.  If you have tried all these things and are unable to have a bowel movement in the first 3-4 days after surgery call either your surgeon or your primary doctor.    If you experience loose stools or diarrhea, hold the  medications until you stool forms back up.  If your symptoms do not get better within 1 week or if they get worse, check with your doctor.  If you experience "the worst abdominal pain ever" or develop nausea or vomiting, please contact the office immediately for further recommendations for treatment.   ITCHING:  If you experience itching with your medications, try taking only a single pain pill, or even half a pain pill at a time.  You can also use Benadryl over the counter for itching or also to help with sleep.   TED HOSE STOCKINGS:  Use stockings on both legs until for at least 2 weeks or as directed by physician office. They may be removed at night for sleeping.  MEDICATIONS:  See your medication summary on the "After Visit Summary" that nursing will review with you.  You may have some home medications which will be placed on hold until you complete the course of blood thinner medication.  It is important for you to complete the blood thinner medication as prescribed.  Take medicines as prescribed.   You have several different medicines that work in different ways. - Tylenol is for mild to moderate pain. Try to take this medicine before turning to your narcotic medicines.  - Celebrex is to reduce pain / inflammation - Robaxin is for muscle spasms. This medicine can make you drowsy. - Oxycodone is a narcotic pain medicine.  Take this for severe pain. This medicine can be dehydrating / constipating. - Zofran is for nausea and vomiting. - Colace is for constipation prevention  - Aspirin is to prevent blood clots after surgery. YOU MUST TAKE THIS MEDICINE!!  PRECAUTIONS:  If you experience chest pain or shortness of breath - call 911 immediately for transfer to the hospital emergency department.   If you develop a fever greater that 101 F, purulent drainage from wound, increased redness or drainage from wound, foul odor from the wound/dressing, or calf pain - CONTACT YOUR SURGEON.                                                    FOLLOW-UP APPOINTMENTS:  If you do not already have a post-op appointment, please call the office [phone] for an appointment to be seen by Dr. Eulah PontMurphy in 2  weeks.   OTHER INSTRUCTIONS:   MAKE SURE YOU:  Understand these instructions.  Get help right away if you are not doing well or get worse.    Thank you for letting us be a part of your medical care team.  It is a privilege we respect greatly.  We hope these instructions will help you stay on track for a fast and full recovery!   Driving restrictions   Complete by: As directed    No driving for 2-4 weeks   Driving restrictions   Complete by: As directed    No driving for 2-4 weeks   Post-operative opioid taper instructions:   Complete by: As directed    POST-OPERATIVE OPIOID TAPER INSTRUCTIONS: It is important to wean off of your opioid medication as soon as possible. If you do not need pain medication after your surgery it is ok to stop day one. Opioids include: Codeine, Hydrocodone(Norco, Vicodin), Oxycodone(Percocet, oxycontin) and hydromorphone amongst others.  Long term and even short term use of opiods can cause: Increased pain response Dependence Constipation Depression Respiratory depression And more.  Withdrawal symptoms can include Flu like symptoms Nausea, vomiting And more Techniques to manage these symptoms Hydrate well Eat regular healthy meals Stay active Use relaxation techniques(deep breathing, meditating, yoga) Do Not substitute Alcohol to help with tapering If you have been on opioids for less than two weeks and do not have pain than it is ok to stop all together.  Plan to wean off of opioids This plan should start within one week post op of your joint replacement. Maintain the same interval or time between taking each dose and first decrease the dose.  Cut the total daily intake of opioids by one tablet each day Next start to increase the time between  doses. The last dose that should be eliminated is the evening dose.      Post-operative opioid taper instructions:   Complete by: As directed    POST-OPERATIVE OPIOID TAPER INSTRUCTIONS: It is important to wean off of your opioid medication as soon as possible. If you do not need pain medication after your surgery it is ok to stop day one. Opioids include: Codeine, Hydrocodone(Norco, Vicodin), Oxycodone(Percocet, oxycontin) and hydromorphone amongst others.  Long term and even short term use of opiods can cause: Increased pain response Dependence Constipation Depression Respiratory depression And more.  Withdrawal symptoms can include Flu like symptoms Nausea, vomiting And more Techniques to manage these symptoms Hydrate well Eat regular healthy meals Stay active Use relaxation techniques(deep breathing, meditating, yoga) Do Not substitute Alcohol to help with tapering If you have been on opioids for less than two weeks and do not have pain than it is ok to stop all together.  Plan to wean off of opioids This plan should start within one week post op of your joint replacement. Maintain the same interval or time between taking each dose and first decrease the dose.  Cut the total daily intake of opioids by one tablet each day Next start to increase the time between doses. The last dose that should be eliminated is the evening dose.      TED hose   Complete by: As directed    Use stockings (TED hose) for 2 weeks on right leg(s).  You may remove them at night for sleeping.   TED hose   Complete by: As directed    Use stockings (TED hose) for 2 weeks on right leg(s).  You may remove them at night  for sleeping.   Weight bearing as tolerated   Complete by: As directed    Laterality: right   Extremity: Lower   Weight bearing as tolerated   Complete by: As directed       Allergies as of 08/12/2022       Reactions   Orange Juice [orange Oil] Nausea And Vomiting         Medication List     TAKE these medications    acetaminophen 500 MG tablet Commonly known as: TYLENOL Take 500 mg by mouth 3 (three) times daily as needed (pain.).   ALPRAZolam 0.5 MG 24 hr tablet Commonly known as: XANAX XR Take 0.5 mg by mouth daily.   aspirin EC 81 MG tablet Take 1 tablet (81 mg total) by mouth 2 (two) times daily. To prevent blood clots for 30 days after surgery.   celecoxib 200 MG capsule Commonly known as: CeleBREX Take 1 capsule (200 mg total) by mouth 2 (two) times daily as needed (for pain and inflammation).   docusate sodium 100 MG capsule Commonly known as: Colace Take 1 capsule (100 mg total) by mouth 2 (two) times daily as needed for mild constipation or moderate constipation.   methocarbamol 750 MG tablet Commonly known as: Robaxin-750 Take 1 tablet (750 mg total) by mouth every 8 (eight) hours as needed for muscle spasms.   multivitamin with minerals Tabs tablet Take 1 tablet by mouth daily. Women's One A Day Multivitamin   omeprazole 40 MG capsule Commonly known as: PRILOSEC Take 40 mg by mouth daily.   ondansetron 4 MG disintegrating tablet Commonly known as: ZOFRAN-ODT Take 1 tablet (4 mg total) by mouth 2 (two) times daily as needed for nausea or vomiting.   OVER THE COUNTER MEDICATION Take 1 tablet by mouth with breakfast, with lunch, and with evening meal. GOLO Release Supplement   oxyCODONE 5 MG immediate release tablet Commonly known as: Roxicodone Take 1 tablet (5 mg total) by mouth every 6 (six) hours as needed for breakthrough pain (after recent surgery that is not resolved by your normal daily dose of Percocet).   oxyCODONE-acetaminophen 10-325 MG tablet Commonly known as: PERCOCET Take 1 tablet by mouth 5 (five) times daily as needed for pain.   topiramate 50 MG tablet Commonly known as: TOPAMAX Take 50 mg by mouth 2 (two) times daily.   VITAMIN B12 TR PO Take 1 tablet by mouth daily.                Discharge Care Instructions  (From admission, onward)           Start     Ordered   08/12/22 0000  Weight bearing as tolerated        08/12/22 1253   08/11/22 0000  Weight bearing as tolerated       Question Answer Comment  Laterality right   Extremity Lower      08/11/22 0956            Follow-up Information     Sheral Apley, MD. Go on 08/26/2022.   Specialty: Orthopedic Surgery Why: at 4:15pm Contact information: [address] [place] [address] [phone]                 Signed: Marzetta Board 08/12/2022, 12:54 PM

## 2022-08-12 NOTE — Progress Notes (Signed)
Discharge instructions given to patient. Verbalizes understanding.

## 2022-08-12 NOTE — TOC Transition Note (Signed)
Transition of Care [facility]) - CM/SW Discharge Note  Patient Details  Name: Grace Carson MRN: [id number] Date of Birth: 05-03-1976  Transition of Care [facility]) CM/SW Contact:  Sherie Don, LCSW Phone Number: 08/12/2022, 10:06 AM  Clinical Narrative: Patient is expected to discharge home after working with PT. CSW met with patient to confirm discharge plan. Patient will go home with OPPT. Patient will need a rolling walker, which was delivered to patient's room by [employer]. TOC signing off.  Final next level of care: OP Rehab Barriers to Discharge: No Barriers Identified  Patient Goals and CMS Choice Patient states their goals for this hospitalization and ongoing recovery are:: Discharge home with [place] CMS Medicare.gov Compare Post Acute Care list provided to:: Patient Choice offered to / list presented to : Patient  Discharge Plan and Services        DME Arranged: Walker rolling DME Agency: [employer] Representative spoke with at DME Agency: Prearranged in orthopedist's office  Readmission Risk Interventions     No data to display

## 2022-08-13 ENCOUNTER — Ambulatory Visit: Payer: Medicaid Other | Attending: Orthopedic Surgery

## 2022-08-13 DIAGNOSIS — M6281 Muscle weakness (generalized): Secondary | ICD-10-CM | POA: Insufficient documentation

## 2022-08-13 DIAGNOSIS — M25551 Pain in right hip: Secondary | ICD-10-CM | POA: Insufficient documentation

## 2022-08-13 DIAGNOSIS — R2689 Other abnormalities of gait and mobility: Secondary | ICD-10-CM | POA: Insufficient documentation

## 2022-08-13 DIAGNOSIS — M25651 Stiffness of right hip, not elsewhere classified: Secondary | ICD-10-CM | POA: Insufficient documentation

## 2022-08-18 NOTE — Therapy (Signed)
OUTPATIENT PHYSICAL THERAPY TREATMENT NOTE   Patient Name: CYDNEE FUQUAY MRN: [id number] DOB:[birth year], 46 y.o., female Today's Date: 08/19/2022  PCP: Norm Salt, [place] REFERRING PROVIDER: Sheral Apley, MD   END OF SESSION:   PT End of Session - 08/19/22 0848     Visit Number 2    Number of Visits 17    Date for PT Re-Evaluation 10/10/22    Authorization Type MCD    Authorization Time Period 8/17-/08/27/22    Authorization - Visit Number 1    Authorization - Number of Visits 3    PT Start Time 0848    PT Stop Time 0930    PT Time Calculation (min) 42 min    Equipment Utilized During Treatment Other (comment)   RW   Activity Tolerance Patient tolerated treatment well    Behavior During Therapy WFL for tasks assessed/performed             Past Medical History:  Diagnosis Date   Arthritis    Bipolar 1 disorder (HCC)    Depression    Post traumatic stress disorder (PTSD)    Social anxiety disorder    Past Surgical History:  Procedure Laterality Date   CESAREAN SECTION     TONSILLECTOMY     TOOTH EXTRACTION     TOTAL HIP ARTHROPLASTY Right 08/11/2022   Procedure: TOTAL HIP ARTHROPLASTY ANTERIOR APPROACH;  Surgeon: Sheral Apley, MD;  Location: [facility];  Service: Orthopedics;  Laterality: Right;   Patient Active Problem List   Diagnosis Date Noted   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 Still painful."  PAIN:  Are you having pain? Yes: NPRS scale: 5/10 Pain location: Rt hip Pain description: nagging Aggravating  factors: movement Relieving factors: rest   OBJECTIVE: (objective measures completed at initial evaluation unless otherwise dated)  DIAGNOSTIC FINDINGS:  IMPRESSION: Intraoperative fluoroscopic images of the right hip demonstrate total arthroplasty. No obvious perihardware fracture or component malpositioning.   PATIENT SURVEYS:  LEFS 13/80   COGNITION:           Overall cognitive status: Within functional limits for tasks assessed                          SENSATION: Not tested   EDEMA:  N/A     POSTURE: rounded shoulders, forward head, and flexed trunk    PALPATION: Not assessed    LOWER EXTREMITY ROM:   Active ROM Right eval Left eval  Hip flexion 80    Hip extension      Hip abduction      Hip adduction      Hip internal rotation      Hip external rotation      Knee flexion      Knee extension      Ankle dorsiflexion      Ankle plantarflexion      Ankle inversion      Ankle eversion       (Blank rows = not tested)   LOWER EXTREMITY MMT:   MMT Right  eval Left eval  Hip flexion 3- 4+  Hip extension      Hip abduction      Hip adduction      Hip internal rotation      Hip external rotation      Knee flexion 4+ 5  Knee extension 4 5  Ankle dorsiflexion      Ankle plantarflexion      Ankle inversion      Ankle eversion       (Blank rows = not tested)   Majority of Rt hip MMT deferred secondary to post-op acuity      FUNCTIONAL TESTS:  5 times sit to stand: 16.3 seconds Timed up and go (TUG): 22 seconds   GAIT: Distance walked: 10 ft Assistive device utilized: Environmental consultant - 2 wheeled Level of assistance: Modified independence Comments: forward flexed posture, decreased hip extension RLE, decreased push-off        TODAY'S TREATMENT: [facility] Adult PT Treatment:                                                DATE: 08/19/22 Therapeutic Exercise: Supine adduction isometric ball squeeze 2 x 10; 5 sec hold Hooklying march 2 x 10  Seated resisted  hip abduction green band 2 x 10  LAQ 2 x 10; 2#  Standing weight shifts lateral and A/P x 10 each in RW Manual Therapy: Gentle PROM of Rt hip into all planes abiding by anterior hip precautions    [facility] Adult PT Treatment:                                                DATE: 08/13/22 Therapeutic Exercise: Demonstrated and issue initial HEP.    Therapeutic Activity: Education on assessment findings that will be addressed throughout duration of POC.          PATIENT EDUCATION:  Education details: encourage neutral hip positioning when resting and doing exercises  Person educated: Patient and partner Education method: Explanation Education comprehension: verbalized understanding     HOME EXERCISE PROGRAM: Access Code: [email or web address] URL: [email or web address] Date: 08/13/2022 Prepared by: Gwendolyn Grant   Exercises - Supine Quad Set  - 2 x daily - 7 x weekly - 2 sets - 10 reps - 5 sec  hold - Seated Long Arc Quad  - 2 x daily - 7 x weekly - 2 sets - 10 reps - 5 sec hold - Long Sitting Calf Stretch with Strap  - 2 x daily - 7 x weekly - 3 sets - 30 sec  hold - Supine Heel Slide  - 1 x daily - 7 x weekly - 1 sets - 10 reps - 5 sec hold - Sit to Stand  - 2 x daily - 7 x weekly - 1 sets - 5 reps   ASSESSMENT:   CLINICAL IMPRESSION: Patient arrives with moderate pain levels that quickly increases with gentle PROM of the Rt hip. She is unable to achieve neutral hip positioning passively or actively due to pain (maintains hip in slight flexed positioned) today, but was encouraged to continue to work into neutral positioning in supine at home and as part of her HEP. Able to progress hip strengthening, which she tolerated  well reporting more discomfort in supine positioning vs. Seated. She declined ice at the end of her sessions with plans to ice later at home.        OBJECTIVE IMPAIRMENTS Abnormal gait, decreased activity tolerance, decreased balance, decreased mobility,  difficulty walking, decreased ROM, decreased strength, improper body mechanics, postural dysfunction, and pain.    ACTIVITY LIMITATIONS carrying, lifting, bending, sitting, standing, squatting, sleeping, stairs, transfers, bed mobility, and locomotion level   PARTICIPATION LIMITATIONS: meal prep, cleaning, laundry, driving, shopping, community activity, and yard work   PERSONAL FACTORS Age and Fitness are also affecting patient's functional outcome.    REHAB POTENTIAL: Good   CLINICAL DECISION MAKING: Stable/uncomplicated   EVALUATION COMPLEXITY: Low     GOALS: Goals reviewed with patient? Yes   SHORT TERM GOALS: Target date:09/10/22 Patient will be independent and compliant with initial HEP.  Baseline: issued at eval  Goal status: INITIAL   2.  Patient will demonstrate at least 4-/5 Rt hip flexor strength to improve ease of getting in and out of her bed.  Baseline: see above  Goal status: INITIAL   3.  Patient will demonstrate at least 90 degrees of Rt hip flexion AROM to improve ability to don/doff shoes and socks. Baseline: see above Goal status: INITIAL     LONG TERM GOALS: Target date: 10/08/22   Patient will complete TUG in </= 12 seconds to reduce her fall risk.  Baseline: see above  Goal status: INITIAL   2.  Patient will complete 5 x STS in </= 12 seconds to improve ease of transfers Baseline: see above  Goal status: INITIAL   3.  Patient will ambulate household and community distance with LRAD Baseline: currently using RW; previously using SPC  Goal status: INITIAL   4.  Patient will score at least 40/80 on the LEFS to signify clinically meaningful improvement in functional abilities.  Baseline: 13/80 Goal status: INITIAL       PLAN: PT FREQUENCY: 2x/week   PT DURATION: 8 weeks   PLANNED INTERVENTIONS: Therapeutic exercises, Therapeutic activity, Neuromuscular re-education, Balance training, Gait training, Patient/Family education, Self Care, Stair  training, DME instructions, Dry Needling, Electrical stimulation, Cryotherapy, Moist heat, Taping, Manual therapy, and Re-evaluation   PLAN FOR NEXT SESSION: review HEP, manual to hip, progress strengthening as tolerated  Letitia Libra, PT, DPT, ATC 08/19/22 9:31 AM

## 2022-08-19 ENCOUNTER — Ambulatory Visit: Payer: Medicaid Other

## 2022-08-19 DIAGNOSIS — M25551 Pain in right hip: Secondary | ICD-10-CM

## 2022-08-19 DIAGNOSIS — M6281 Muscle weakness (generalized): Secondary | ICD-10-CM

## 2022-08-19 DIAGNOSIS — M25651 Stiffness of right hip, not elsewhere classified: Secondary | ICD-10-CM

## 2022-08-19 DIAGNOSIS — R2689 Other abnormalities of gait and mobility: Secondary | ICD-10-CM

## 2022-08-21 ENCOUNTER — Ambulatory Visit: Payer: Medicaid Other

## 2022-08-21 DIAGNOSIS — M25551 Pain in right hip: Secondary | ICD-10-CM | POA: Diagnosis not present

## 2022-08-21 DIAGNOSIS — M6281 Muscle weakness (generalized): Secondary | ICD-10-CM

## 2022-08-21 DIAGNOSIS — R2689 Other abnormalities of gait and mobility: Secondary | ICD-10-CM

## 2022-08-21 DIAGNOSIS — M25651 Stiffness of right hip, not elsewhere classified: Secondary | ICD-10-CM

## 2022-08-21 NOTE — Therapy (Signed)
OUTPATIENT PHYSICAL THERAPY TREATMENT NOTE   Patient Name: Grace Carson MRN: [id number] DOB:May 11, 1976, 46 y.o., female Today's Date: 08/21/2022  PCP: Norm Salt, [place] REFERRING PROVIDER: Sheral Apley, MD   END OF SESSION:   PT End of Session - 08/21/22 0845     Visit Number 3    Number of Visits 17    Date for PT Re-Evaluation 10/10/22    Authorization Type MCD    Authorization Time Period 8/17-/08/27/22    Authorization - Visit Number 2    Authorization - Number of Visits 3    PT Start Time 0845    PT Stop Time 0926    PT Time Calculation (min) 41 min    Equipment Utilized During Treatment Other (comment)   SPC   Activity Tolerance Patient tolerated treatment well    Behavior During Therapy WFL for tasks assessed/performed              Past Medical History:  Diagnosis Date   Arthritis    Bipolar 1 disorder (HCC)    Depression    Post traumatic stress disorder (PTSD)    Social anxiety disorder    Past Surgical History:  Procedure Laterality Date   CESAREAN SECTION     TONSILLECTOMY     TOOTH EXTRACTION     TOTAL HIP ARTHROPLASTY Right 08/11/2022   Procedure: TOTAL HIP ARTHROPLASTY ANTERIOR APPROACH;  Surgeon: Sheral Apley, MD;  Location: [facility];  Service: Orthopedics;  Laterality: Right;   Patient Active Problem List   Diagnosis Date Noted   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   PAIN:  Are you having pain? Yes: NPRS scale: 4/10 Pain  location: Rt hip Pain description: nagging Aggravating factors: movement Relieving factors: rest   OBJECTIVE: (objective measures completed at initial evaluation unless otherwise dated)  DIAGNOSTIC FINDINGS:  IMPRESSION: Intraoperative fluoroscopic images of the right hip demonstrate total arthroplasty. No obvious perihardware fracture or component malpositioning.   PATIENT SURVEYS:  LEFS 13/80   COGNITION:           Overall cognitive status: Within functional limits for tasks assessed                          SENSATION: Not tested   EDEMA:  N/A     POSTURE: rounded shoulders, forward head, and flexed trunk    PALPATION: Not assessed    LOWER EXTREMITY ROM:   Active ROM Right eval Left eval  Hip flexion 80    Hip extension      Hip abduction      Hip adduction      Hip internal rotation      Hip external rotation      Knee flexion      Knee extension      Ankle dorsiflexion      Ankle plantarflexion      Ankle inversion      Ankle eversion       (Blank rows =  not tested)   LOWER EXTREMITY MMT:   MMT Right eval Left eval  Hip flexion 3- 4+  Hip extension      Hip abduction      Hip adduction      Hip internal rotation      Hip external rotation      Knee flexion 4+ 5  Knee extension 4 5  Ankle dorsiflexion      Ankle plantarflexion      Ankle inversion      Ankle eversion       (Blank rows = not tested)   Majority of Rt hip MMT deferred secondary to post-op acuity      FUNCTIONAL TESTS:  5 times sit to stand: 16.3 seconds Timed up and go (TUG): 22 seconds RW; 08/21/22: 17.8 SPC    GAIT: Distance walked: 10 ft Assistive device utilized: Environmental consultant - 2 wheeled Level of assistance: Modified independence Comments: forward flexed posture, decreased hip extension RLE, decreased push-off        TODAY'S TREATMENT: [facility] Adult PT Treatment:                                                DATE: 08/21/22 Therapeutic Exercise: Calf stretch with strap  2 x 30 sec Heel slides x 5 RLE  Clamshells 2 x 10 RLE Sidelying partial range hip extension 2 x 10 RLE Sit to stand 2 x 10 raised height  Standing calf raise 2 x 10  Seated HS curl green band 2 x 10 Updated HEP Manual Therapy: Gentle PROM of Rt hip into all planes abiding by anterior hip precautions    [facility] Adult PT Treatment:                                                DATE: 08/19/22 Therapeutic Exercise: Supine adduction isometric ball squeeze 2 x 10; 5 sec hold Hooklying march 2 x 10  Seated resisted hip abduction green band 2 x 10  LAQ 2 x 10; 2#  Standing weight shifts lateral and A/P x 10 each in RW Manual Therapy: Gentle PROM of Rt hip into all planes abiding by anterior hip precautions    [facility] Adult PT Treatment:                                                DATE: 08/13/22 Therapeutic Exercise: Demonstrated and issue initial HEP.    Therapeutic Activity: Education on assessment findings that will be addressed throughout duration of POC.          PATIENT EDUCATION:  Education details: HEP Person educated: Patient and partner Education method: Programmer, multimedia, demo, cues, handout Education comprehension: verbalized understanding, returned demo, cues      HOME EXERCISE PROGRAM: Access Code: [email or web address] URL: [email or web address] Date: 08/13/2022 Prepared by: Letitia Libra   Exercises - Supine Quad Set  - 2 x daily - 7 x weekly - 2 sets - 10 reps - 5 sec  hold - Seated Long Arc Quad  - 2 x daily - 7 x weekly - 2 sets - 10 reps -  5 sec hold - Long Sitting Calf Stretch with Strap  - 2 x daily - 7 x weekly - 3 sets - 30 sec  hold - Supine Heel Slide  - 1 x daily - 7 x weekly - 1 sets - 10 reps - 5 sec hold - Sit to Stand  - 2 x daily - 7 x weekly - 1 sets - 5 reps   ASSESSMENT:   CLINICAL IMPRESSION:  Improved tolerance to Rt hip PROM today compared to last visit, though continues to be limited in extension ROM secondary to pain. Continued with  progression of hip strengthening with good tolerance. She demonstrates good form with sit to stand requiring cues initially to allow for fully erect posture as she has tendency to maintain forward flexed posture when standing. Her TUG score has improved compared to baseline, nearing this LTG. HEP was updated to include further RLE strengthening.      OBJECTIVE IMPAIRMENTS Abnormal gait, decreased activity tolerance, decreased balance, decreased mobility, difficulty walking, decreased ROM, decreased strength, improper body mechanics, postural dysfunction, and pain.    ACTIVITY LIMITATIONS carrying, lifting, bending, sitting, standing, squatting, sleeping, stairs, transfers, bed mobility, and locomotion level   PARTICIPATION LIMITATIONS: meal prep, cleaning, laundry, driving, shopping, community activity, and yard work   PERSONAL FACTORS Age and Fitness are also affecting patient's functional outcome.    REHAB POTENTIAL: Good   CLINICAL DECISION MAKING: Stable/uncomplicated   EVALUATION COMPLEXITY: Low     GOALS: Goals reviewed with patient? Yes   SHORT TERM GOALS: Target date:09/10/22 Patient will be independent and compliant with initial HEP.  Baseline: issued at eval  Goal status: INITIAL   2.  Patient will demonstrate at least 4-/5 Rt hip flexor strength to improve ease of getting in and out of her bed.  Baseline: see above  Goal status: INITIAL   3.  Patient will demonstrate at least 90 degrees of Rt hip flexion AROM to improve ability to don/doff shoes and socks. Baseline: see above Goal status: INITIAL     LONG TERM GOALS: Target date: 10/08/22   Patient will complete TUG in </= 12 seconds to reduce her fall risk.  Baseline: see above  Goal status: INITIAL   2.  Patient will complete 5 x STS in </= 12 seconds to improve ease of transfers Baseline: see above  Goal status: INITIAL   3.  Patient will ambulate household and community distance with LRAD Baseline:  currently using RW; previously using SPC  Goal status: INITIAL   4.  Patient will score at least 40/80 on the LEFS to signify clinically meaningful improvement in functional abilities.  Baseline: 13/80 Goal status: INITIAL       PLAN: PT FREQUENCY: 2x/week   PT DURATION: 8 weeks   PLANNED INTERVENTIONS: Therapeutic exercises, Therapeutic activity, Neuromuscular re-education, Balance training, Gait training, Patient/Family education, Self Care, Stair training, DME instructions, Dry Needling, Electrical stimulation, Cryotherapy, Moist heat, Taping, Manual therapy, and Re-evaluation   PLAN FOR NEXT SESSION: review HEP, manual to hip, progress strengthening as tolerated   Letitia Libra, PT, DPT, ATC 08/21/22 9:28 AM

## 2022-08-25 NOTE — Therapy (Signed)
OUTPATIENT PHYSICAL THERAPY TREATMENT NOTE/PROGRESS NOTE   Patient Name: Grace Carson MRN: [id number] DOB:[birth year], 46 y.o., female 75 Date: 08/26/2022  PCP: Trey Sailors, [place] REFERRING PROVIDER: Renette Butters, MD   END OF SESSION:   PT End of Session - 08/26/22 0929     Visit Number 4    Number of Visits 17    Date for PT Re-Evaluation 10/10/22    Authorization Type MCD (requesting auth)    Authorization Time Period 8/17-/08/27/22    Authorization - Visit Number 3    Authorization - Number of Visits 3    PT Start Time 0930    PT Stop Time 1012    PT Time Calculation (min) 42 min    Equipment Utilized During Treatment Other (comment)   [place]   Activity Tolerance Patient tolerated treatment well    Behavior During Therapy WFL for tasks assessed/performed               Past Medical History:  Diagnosis Date   Arthritis    Bipolar 1 disorder ([place])    Depression    Post traumatic stress disorder (PTSD)    Social anxiety disorder    Past Surgical History:  Procedure Laterality Date   CESAREAN SECTION     TONSILLECTOMY     TOOTH EXTRACTION     TOTAL HIP ARTHROPLASTY Right 08/11/2022   Procedure: TOTAL HIP ARTHROPLASTY ANTERIOR APPROACH;  Surgeon: Renette Butters, MD;  Location: [facility];  Service: Orthopedics;  Laterality: Right;   Patient Active Problem List   Diagnosis Date Noted   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 Patient has f/u with surgeon later today. She reports compliance with  HEP.   PAIN:  Are you having pain? Yes: NPRS scale: 4/10 Pain location: Rt hip Pain description: dull;ache; sore Aggravating factors: movement Relieving factors: rest   OBJECTIVE: (objective measures completed at initial evaluation unless otherwise dated)  DIAGNOSTIC FINDINGS:  IMPRESSION: Intraoperative fluoroscopic images of the right hip demonstrate total arthroplasty. No obvious perihardware fracture or component malpositioning.   PATIENT SURVEYS:  LEFS 13/80 08/26/22: 48/80   COGNITION:           Overall cognitive status: Within functional limits for tasks assessed                          SENSATION: Not tested   EDEMA:  N/A     POSTURE: rounded shoulders, forward head, and flexed trunk    PALPATION: Not assessed    LOWER EXTREMITY ROM:   Active ROM Right eval Left eval 08/26/22 Right  Hip flexion 80   92  Hip extension       Hip abduction       Hip adduction       Hip internal rotation       Hip external rotation       Knee flexion       Knee extension       Ankle dorsiflexion       Ankle plantarflexion  Ankle inversion       Ankle eversion        (Blank rows = not tested)   LOWER EXTREMITY MMT:   MMT Right eval Left eval 08/26/22  Hip flexion 3- 4+ Rt: 4-/5  Hip extension     Rt: 3-/5  Hip abduction     Rt: 3+/5   Hip adduction       Hip internal rotation       Hip external rotation       Knee flexion 4+ 5 Rt: 5/5  Knee extension 4 5 Rt: 5/5  Ankle dorsiflexion       Ankle plantarflexion       Ankle inversion       Ankle eversion        (Blank rows = not tested)   Majority of Rt hip MMT deferred secondary to post-op acuity      FUNCTIONAL TESTS:  5 times sit to stand: 16.3 seconds; 08/26/22: 10 seconds  Timed up and go (TUG): 22 seconds RW; 08/21/22: 17.8 SPC; 08/26/22: 9.2 SPC   GAIT: Distance walked: 10 ft Assistive device utilized: Environmental consultant - 2 wheeled Level of assistance: Modified independence Comments: forward flexed  posture, decreased hip extension RLE, decreased push-off   08/26/22: Mod I with SPC; decreased hip extension, decreased push-off, forward flexed posture        TODAY'S TREATMENT: [facility] Adult PT Treatment:                                                DATE: 08/26/22 Therapeutic Exercise: Sit to stand 2 x 10  Standing hip abduction 2 x 10  Standing march 2 x 10  LAQ 2 x 10; 4 lbs SLS RLE x 2; 2-10 seconds  Standing hip extension (partial range on RLE, minimally past neutral) 2 x 10  SLR (unable) Hip bridge 2 x 10  5 x STS TUG  [facility] Adult PT Treatment:                                                DATE: 08/21/22 Therapeutic Exercise: Calf stretch with strap 2 x 30 sec Heel slides x 5 RLE  Clamshells 2 x 10 RLE Sidelying partial range hip extension 2 x 10 RLE Sit to stand 2 x 10 raised height  Standing calf raise 2 x 10  Seated HS curl green band 2 x 10 Updated HEP Manual Therapy: Gentle PROM of Rt hip into all planes abiding by anterior hip precautions    [facility] Adult PT Treatment:                                                DATE: 08/19/22 Therapeutic Exercise: Supine adduction isometric ball squeeze 2 x 10; 5 sec hold Hooklying march 2 x 10  Seated resisted hip abduction green band 2 x 10  LAQ 2 x 10; 2#  Standing weight shifts lateral and A/P x 10 each in RW Manual Therapy: Gentle PROM of Rt hip into all planes abiding by anterior hip precautions  PATIENT EDUCATION:  Education details: N/A Person educated: N/A Education method: N/A Education comprehension: N/A     HOME EXERCISE PROGRAM: Access Code: [email or web address] URL: [email or web address] Date: 08/13/2022 Prepared by: Gwendolyn Grant   Exercises - Supine Quad Set  - 2 x daily - 7 x weekly - 2 sets - 10 reps - 5 sec  hold - Seated Long Arc Quad  - 2 x daily - 7 x weekly - 2 sets - 10 reps - 5 sec hold - Long Sitting Calf Stretch with Strap  - 2 x daily - 7 x weekly - 3 sets - 30 sec   hold - Supine Heel Slide  - 1 x daily - 7 x weekly - 1 sets - 10 reps - 5 sec hold - Sit to Stand  - 2 x daily - 7 x weekly - 1 sets - 5 reps   ASSESSMENT:   CLINICAL IMPRESSION: Patient is 2 weeks s/p Rt THA progressing as expected given her recent post-operative status. She demonstrates improvement in hip flexion/knee strength, hip ROM, 5 x STS and TUG scores, and activity tolerance. She has met all short term functional goals at this time and 2 out of 6 long term functional goals. She will benefit from continuing with current POC (twice weekly for 6 additional weeks) to assist in addressing lingering strength, ROM, gait, and balance deficits in order to optimize her function and maximize her independence with daily activity.      OBJECTIVE IMPAIRMENTS Abnormal gait, decreased activity tolerance, decreased balance, decreased mobility, difficulty walking, decreased ROM, decreased strength, improper body mechanics, postural dysfunction, and pain.    ACTIVITY LIMITATIONS carrying, lifting, bending, sitting, standing, squatting, sleeping, stairs, transfers, bed mobility, and locomotion level   PARTICIPATION LIMITATIONS: meal prep, cleaning, laundry, driving, shopping, community activity, and yard work   PERSONAL FACTORS Age and Fitness are also affecting patient's functional outcome.    REHAB POTENTIAL: Good   CLINICAL DECISION MAKING: Stable/uncomplicated   EVALUATION COMPLEXITY: Low     GOALS: Goals reviewed with patient? Yes   SHORT TERM GOALS: Target date:09/10/22 Patient will be independent and compliant with initial HEP.  Baseline: issued at eval  Goal status: met   2.  Patient will demonstrate at least 4-/5 Rt hip flexor strength to improve ease of getting in and out of her bed.  Baseline: see above  Goal status: met   3.  Patient will demonstrate at least 90 degrees of Rt hip flexion AROM to improve ability to don/doff shoes and socks. Baseline: see above Goal status:  met     LONG TERM GOALS: Target date: 10/08/22   Patient will complete TUG in </= 12 seconds to reduce her fall risk.  Baseline: see above  Goal status: met   2.  Patient will complete 5 x STS in </= 12 seconds to improve ease of transfers Baseline: see above  Goal status: met   3.  Patient will ambulate household and community distance with LRAD Baseline: currently using RW; 08/26/22: [place] currently  Goal status: ongoing   4.  Patient will score at least 60/80 on the LEFS to signify clinically meaningful improvement in functional abilities.  Baseline: 13/80; 48/80 08/26/22 Goal status: revised   5. Patient will demonstrate at least 4+/5 gross Rt hip strength to improve stability with walking and standing activity.  Baseline: see above Goal status: initial   6. Patient will report pain as </=3/10 to reduce her current functional limitations. Baseline: 5/10 at worst  Goal status: initial       PLAN: PT FREQUENCY: 2x/week   PT DURATION: 8 weeks   PLANNED INTERVENTIONS: Therapeutic exercises, Therapeutic activity, Neuromuscular re-education, Balance training, Gait training, Patient/Family education, Self Care, Stair training, DME instructions, Dry Needling, Electrical stimulation, Cryotherapy, Moist heat, Taping, Manual therapy, and Re-evaluation   PLAN FOR NEXT SESSION: review HEP, manual to hip, progress strengthening as tolerated   Gwendolyn Grant, PT, DPT, ATC 08/26/22 10:13 AM

## 2022-08-26 ENCOUNTER — Ambulatory Visit: Payer: Medicaid Other

## 2022-08-26 DIAGNOSIS — M6281 Muscle weakness (generalized): Secondary | ICD-10-CM

## 2022-08-26 DIAGNOSIS — M25551 Pain in right hip: Secondary | ICD-10-CM | POA: Diagnosis not present

## 2022-08-26 DIAGNOSIS — M25651 Stiffness of right hip, not elsewhere classified: Secondary | ICD-10-CM

## 2022-08-26 DIAGNOSIS — R2689 Other abnormalities of gait and mobility: Secondary | ICD-10-CM

## 2022-08-27 NOTE — Therapy (Signed)
OUTPATIENT PHYSICAL THERAPY TREATMENT NOTE   Patient Name: Grace Carson MRN: [id number] DOB:December 28, 1976, 46 y.o., female 69 Date: 08/28/2022  PCP: Trey Sailors, [place] REFERRING PROVIDER: Renette Butters, MD   END OF SESSION:   PT End of Session - 08/28/22 1048     Visit Number 5    Number of Visits 17    Date for PT Re-Evaluation 10/10/22    Authorization Type MCD auth pending    Authorization Time Period --    Authorization - Visit Number --    Authorization - Number of Visits --    PT Start Time 1047   patient late   PT Stop Time 1127    PT Time Calculation (min) 40 min    Equipment Utilized During Treatment --    Activity Tolerance Patient tolerated treatment well    Behavior During Therapy WFL for tasks assessed/performed                Past Medical History:  Diagnosis Date   Arthritis    Bipolar 1 disorder ([place])    Depression    Post traumatic stress disorder (PTSD)    Social anxiety disorder    Past Surgical History:  Procedure Laterality Date   CESAREAN SECTION     TONSILLECTOMY     TOOTH EXTRACTION     TOTAL HIP ARTHROPLASTY Right 08/11/2022   Procedure: TOTAL HIP ARTHROPLASTY ANTERIOR APPROACH;  Surgeon: Renette Butters, MD;  Location: [facility];  Service: Orthopedics;  Laterality: Right;   Patient Active Problem List   Diagnosis Date Noted   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 She has next f/u surgeon at the end of September. She reports the hip isn't  feeling too bad right now.   PAIN:  Are you having pain? Yes: NPRS scale: 2/10 Pain location: Rt hip Pain description: dull;ache; sore Aggravating factors: movement Relieving factors: rest   OBJECTIVE: (objective measures completed at initial evaluation unless otherwise dated)  DIAGNOSTIC FINDINGS:  IMPRESSION: Intraoperative fluoroscopic images of the right hip demonstrate total arthroplasty. No obvious perihardware fracture or component malpositioning.   PATIENT SURVEYS:  LEFS 13/80 08/26/22: 48/80   COGNITION:           Overall cognitive status: Within functional limits for tasks assessed                          SENSATION: Not tested   EDEMA:  N/A     POSTURE: rounded shoulders, forward head, and flexed trunk    PALPATION: Not assessed    LOWER EXTREMITY ROM:   Active ROM Right eval Left eval 08/26/22 Right  Hip flexion 80   92  Hip extension       Hip abduction       Hip adduction       Hip internal rotation       Hip external rotation       Knee flexion       Knee extension  Ankle dorsiflexion       Ankle plantarflexion       Ankle inversion       Ankle eversion        (Blank rows = not tested)   LOWER EXTREMITY MMT:   MMT Right eval Left eval 08/26/22  Hip flexion 3- 4+ Rt: 4-/5  Hip extension     Rt: 3-/5  Hip abduction     Rt: 3+/5   Hip adduction       Hip internal rotation       Hip external rotation       Knee flexion 4+ 5 Rt: 5/5  Knee extension 4 5 Rt: 5/5  Ankle dorsiflexion       Ankle plantarflexion       Ankle inversion       Ankle eversion        (Blank rows = not tested)   Majority of Rt hip MMT deferred secondary to post-op acuity      FUNCTIONAL TESTS:  5 times sit to stand: 16.3 seconds; 08/26/22: 10 seconds  Timed up and go (TUG): 22 seconds RW; 08/21/22: 17.8 SPC; 08/26/22: 9.2 SPC   GAIT: Distance walked: 10 ft Assistive device utilized: Environmental consultant - 2 wheeled Level of assistance: Modified  independence Comments: forward flexed posture, decreased hip extension RLE, decreased push-off   08/26/22: Mod I with SPC; decreased hip extension, decreased push-off, forward flexed posture        TODAY'S TREATMENT: [facility] Adult PT Treatment:                                                DATE: 08/28/22 Therapeutic Exercise: NuStep level 5 x 5 minutes  Sidelying hip abduction Rt 2 x 10  Hip bridge 2 x 10  Quad sets 1 x 10; 5 sec hold  SLR attempted Heel slide with stability ball RLE 1 x 10; 5 sec hold Mini squats with UE support 2 x 10  Supine posterior pelvic tilts 2 x 10; 5 sec hold  Updated HEP Manual Therapy: PROM of Rt hip into all planes abiding by anterior hip precautions    [facility] Adult PT Treatment:                                                DATE: 08/26/22 Therapeutic Exercise: Sit to stand 2 x 10  Standing hip abduction 2 x 10  Standing march 2 x 10  LAQ 2 x 10; 4 lbs SLS RLE x 2; 2-10 seconds  Standing hip extension (partial range on RLE, minimally past neutral) 2 x 10  SLR (unable) Hip bridge 2 x 10  5 x STS TUG  [facility] Adult PT Treatment:                                                DATE: 08/21/22 Therapeutic Exercise: Calf stretch with strap 2 x 30 sec Heel slides x 5 RLE  Clamshells 2 x 10 RLE Sidelying partial range hip extension 2 x 10 RLE Sit to stand 2 x 10 raised height  Standing calf raise 2 x 10  Seated HS curl green band 2 x 10 Updated HEP Manual Therapy: Gentle PROM of Rt hip into all planes abiding by anterior hip precautions            PATIENT EDUCATION:  Education details: HEP Person educated: patient and partner Education method: Systems developer, cues, handout Education comprehension: returned demo, cues      HOME EXERCISE PROGRAM: Access Code: [email or web address] URL: [email or web address] Date: 08/13/2022 Prepared by: Gwendolyn Grant   Exercises - Supine Quad Set  - 2 x daily - 7 x weekly - 2 sets - 10 reps - 5 sec  hold - Seated  Long Arc Quad  - 2 x daily - 7 x weekly - 2 sets - 10 reps - 5 sec hold - Long Sitting Calf Stretch with Strap  - 2 x daily - 7 x weekly - 3 sets - 30 sec  hold - Supine Heel Slide  - 1 x daily - 7 x weekly - 1 sets - 10 reps - 5 sec hold - Sit to Stand  - 2 x daily - 7 x weekly - 1 sets - 5 reps   ASSESSMENT:   CLINICAL IMPRESSION:  Patient tolerated session well today with focus on Rt hip ROM and strengthening. She tolerates PROM well reporting mild discomfort at available end range in all planes. She has initial difficulty activating her quad, but with moderate cues for proper quad set she is able to activate, but still unable to perform SLR due to weakness. Overall good tolerance to today's session reporting discomfort with sidelying hip abduction, otherwise no reports of increased pain with ther ex.   OBJECTIVE IMPAIRMENTS Abnormal gait, decreased activity tolerance, decreased balance, decreased mobility, difficulty walking, decreased ROM, decreased strength, improper body mechanics, postural dysfunction, and pain.    ACTIVITY LIMITATIONS carrying, lifting, bending, sitting, standing, squatting, sleeping, stairs, transfers, bed mobility, and locomotion level   PARTICIPATION LIMITATIONS: meal prep, cleaning, laundry, driving, shopping, community activity, and yard work   PERSONAL FACTORS Age and Fitness are also affecting patient's functional outcome.    REHAB POTENTIAL: Good   CLINICAL DECISION MAKING: Stable/uncomplicated   EVALUATION COMPLEXITY: Low     GOALS: Goals reviewed with patient? Yes   SHORT TERM GOALS: Target date:09/10/22 Patient will be independent and compliant with initial HEP.  Baseline: issued at eval  Goal status: met   2.  Patient will demonstrate at least 4-/5 Rt hip flexor strength to improve ease of getting in and out of her bed.  Baseline: see above  Goal status: met   3.  Patient will demonstrate at least 90 degrees of Rt hip flexion AROM to improve  ability to don/doff shoes and socks. Baseline: see above Goal status: met     LONG TERM GOALS: Target date: 10/08/22   Patient will complete TUG in </= 12 seconds to reduce her fall risk.  Baseline: see above  Goal status: met   2.  Patient will complete 5 x STS in </= 12 seconds to improve ease of transfers Baseline: see above  Goal status: met   3.  Patient will ambulate household and community distance with LRAD Baseline: currently using RW; 08/26/22: [place] currently  Goal status: ongoing   4.  Patient will score at least 60/80 on the LEFS to signify clinically meaningful improvement in functional abilities.  Baseline: 13/80; 48/80 08/26/22 Goal status: revised   5. Patient will demonstrate at least 4+/5 gross Rt hip strength  to improve stability with walking and standing activity.  Baseline: see above Goal status: initial   6. Patient will report pain as </=3/10 to reduce her current functional limitations. Baseline: 5/10 at worst Goal status: initial       PLAN: PT FREQUENCY: 2x/week   PT DURATION: 8 weeks   PLANNED INTERVENTIONS: Therapeutic exercises, Therapeutic activity, Neuromuscular re-education, Balance training, Gait training, Patient/Family education, Self Care, Stair training, DME instructions, Dry Needling, Electrical stimulation, Cryotherapy, Moist heat, Taping, Manual therapy, and Re-evaluation   PLAN FOR NEXT SESSION: review HEP, manual to hip, progress strengthening as tolerated   Gwendolyn Grant, PT, DPT, ATC 08/28/22 11:28 AM

## 2022-08-28 ENCOUNTER — Ambulatory Visit: Payer: Medicaid Other | Attending: Orthopedic Surgery

## 2022-08-28 ENCOUNTER — Telehealth: Payer: Self-pay

## 2022-08-28 DIAGNOSIS — R2689 Other abnormalities of gait and mobility: Secondary | ICD-10-CM | POA: Insufficient documentation

## 2022-08-28 DIAGNOSIS — M25651 Stiffness of right hip, not elsewhere classified: Secondary | ICD-10-CM | POA: Diagnosis present

## 2022-08-28 DIAGNOSIS — M6281 Muscle weakness (generalized): Secondary | ICD-10-CM | POA: Insufficient documentation

## 2022-08-28 DIAGNOSIS — M25551 Pain in right hip: Secondary | ICD-10-CM | POA: Insufficient documentation

## 2022-08-28 NOTE — Telephone Encounter (Signed)
LVM regarding missed PT appointment. Reviewed attendance policy and reminded patient of next scheduled visit.   Letitia Libra, PT, DPT, ATC 08/28/22 10:36 AM

## 2022-09-02 ENCOUNTER — Ambulatory Visit: Payer: Medicaid Other

## 2022-09-02 DIAGNOSIS — M25551 Pain in right hip: Secondary | ICD-10-CM | POA: Diagnosis not present

## 2022-09-02 DIAGNOSIS — R2689 Other abnormalities of gait and mobility: Secondary | ICD-10-CM

## 2022-09-02 DIAGNOSIS — M6281 Muscle weakness (generalized): Secondary | ICD-10-CM

## 2022-09-02 DIAGNOSIS — M25651 Stiffness of right hip, not elsewhere classified: Secondary | ICD-10-CM

## 2022-09-02 NOTE — Therapy (Signed)
OUTPATIENT PHYSICAL THERAPY TREATMENT NOTE   Patient Name: Grace Carson MRN: [id number] DOB:[birth year], 46 y.o., female 27 Date: 09/02/2022  PCP: Trey Sailors, [place] REFERRING PROVIDER: Renette Butters, MD   END OF SESSION:   PT End of Session - 09/02/22 1147     Visit Number 6    Number of Visits 17    Date for PT Re-Evaluation 10/10/22    Authorization Type MCD auth pending    PT Start Time 1147    PT Stop Time 1225    PT Time Calculation (min) 38 min    Activity Tolerance Patient tolerated treatment well    Behavior During Therapy WFL for tasks assessed/performed                Past Medical History:  Diagnosis Date   Arthritis    Bipolar 1 disorder ([place])    Depression    Post traumatic stress disorder (PTSD)    Social anxiety disorder    Past Surgical History:  Procedure Laterality Date   CESAREAN SECTION     TONSILLECTOMY     TOOTH EXTRACTION     TOTAL HIP ARTHROPLASTY Right 08/11/2022   Procedure: TOTAL HIP ARTHROPLASTY ANTERIOR APPROACH;  Surgeon: Renette Butters, MD;  Location: [facility];  Service: Orthopedics;  Laterality: Right;   Patient Active Problem List   Diagnosis Date Noted   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 She slept pretty well last night.   PAIN:  Are you having pain?None currently; at worst Yes: NPRS scale: 2/10 Pain location: Rt hip Pain description: dull;ache; sore Aggravating factors: movement Relieving factors: rest   OBJECTIVE: (objective measures completed at  initial evaluation unless otherwise dated)  DIAGNOSTIC FINDINGS:  IMPRESSION: Intraoperative fluoroscopic images of the right hip demonstrate total arthroplasty. No obvious perihardware fracture or component malpositioning.   PATIENT SURVEYS:  LEFS 13/80 08/26/22: 48/80   COGNITION:           Overall cognitive status: Within functional limits for tasks assessed                          SENSATION: Not tested   EDEMA:  N/A     POSTURE: rounded shoulders, forward head, and flexed trunk    PALPATION: Not assessed    LOWER EXTREMITY ROM:   Active ROM Right eval Left eval 08/26/22 Right  Hip flexion 80   92  Hip extension       Hip abduction       Hip adduction       Hip internal rotation       Hip external rotation       Knee flexion       Knee extension       Ankle dorsiflexion       Ankle plantarflexion       Ankle inversion       Ankle eversion        (Blank rows = not tested)   LOWER EXTREMITY MMT:   MMT Right eval  Left eval 08/26/22  Hip flexion 3- 4+ Rt: 4-/5  Hip extension     Rt: 3-/5  Hip abduction     Rt: 3+/5   Hip adduction       Hip internal rotation       Hip external rotation       Knee flexion 4+ 5 Rt: 5/5  Knee extension 4 5 Rt: 5/5  Ankle dorsiflexion       Ankle plantarflexion       Ankle inversion       Ankle eversion        (Blank rows = not tested)   Majority of Rt hip MMT deferred secondary to post-op acuity      FUNCTIONAL TESTS:  5 times sit to stand: 16.3 seconds; 08/26/22: 10 seconds  Timed up and go (TUG): 22 seconds RW; 08/21/22: 17.8 SPC; 08/26/22: 9.2 SPC   GAIT: Distance walked: 10 ft Assistive device utilized: Environmental consultant - 2 wheeled Level of assistance: Modified independence Comments: forward flexed posture, decreased hip extension RLE, decreased push-off   08/26/22: Mod I with SPC; decreased hip extension, decreased push-off, forward flexed posture        TODAY'S TREATMENT: [facility] Adult PT Treatment:                                                 DATE: 09/02/22 Therapeutic Exercise: NuStep level 5 x 5 minutes  Prone gluteal sets 2 x 10; 5 sec hold  Prone quad stretch x 60 sec each  Prone HS curl 2 x 10; green band  Resisted knee extension 3 x 10 @ 25 lbs  2 way hip (flexion/abduction 2 x 10 each) Sidelying hip abduction RLE 2 x 10  Leg press 3 x 10 @ 40 lbs  [facility] Adult PT Treatment:                                                DATE: 08/28/22 Therapeutic Exercise: NuStep level 5 x 5 minutes  Sidelying hip abduction Rt 2 x 10  Hip bridge 2 x 10  Quad sets 1 x 10; 5 sec hold  SLR attempted Heel slide with stability ball RLE 1 x 10; 5 sec hold Mini squats with UE support 2 x 10  Supine posterior pelvic tilts 2 x 10; 5 sec hold  Updated HEP Manual Therapy: PROM of Rt hip into all planes abiding by anterior hip precautions    [facility] Adult PT Treatment:                                                DATE: 08/26/22 Therapeutic Exercise: Sit to stand 2 x 10  Standing hip abduction 2 x 10  Standing march 2 x 10  LAQ 2 x 10; 4 lbs SLS RLE x 2; 2-10 seconds  Standing hip extension (partial range on RLE, minimally past neutral) 2 x 10  SLR (unable) Hip bridge 2 x 10  5 x STS TUG            PATIENT EDUCATION:  Education details: N/A  Person educated: N/A Education method: N/A Education comprehension: N/A     HOME EXERCISE PROGRAM: Access Code: [email or web address] URL: [email or web address] Date: 08/13/2022 Prepared by: Gwendolyn Grant   Exercises - Supine Quad Set  - 2 x daily - 7 x weekly - 2 sets - 10 reps - 5 sec  hold - Seated Long Arc Quad  - 2 x daily - 7 x weekly - 2 sets - 10 reps - 5 sec hold - Long Sitting Calf Stretch with Strap  - 2 x daily - 7 x weekly - 3 sets - 30 sec  hold - Supine Heel Slide  - 1 x daily - 7 x weekly - 1 sets - 10 reps - 5 sec hold - Sit to Stand  - 2 x daily - 7 x weekly - 1 sets - 5 reps   ASSESSMENT:   CLINICAL IMPRESSION: Patient tolerated  session well today with continued progression of hip and knee strengthening. Began prone exercises with good tolerance and no reports of pain. Able to progress standing strengthening with patient demonstrating fair postural control during SL activity on the RLE requiring occasional use of reaching strategy to maintain balance.   OBJECTIVE IMPAIRMENTS Abnormal gait, decreased activity tolerance, decreased balance, decreased mobility, difficulty walking, decreased ROM, decreased strength, improper body mechanics, postural dysfunction, and pain.    ACTIVITY LIMITATIONS carrying, lifting, bending, sitting, standing, squatting, sleeping, stairs, transfers, bed mobility, and locomotion level   PARTICIPATION LIMITATIONS: meal prep, cleaning, laundry, driving, shopping, community activity, and yard work   PERSONAL FACTORS Age and Fitness are also affecting patient's functional outcome.    REHAB POTENTIAL: Good   CLINICAL DECISION MAKING: Stable/uncomplicated   EVALUATION COMPLEXITY: Low     GOALS: Goals reviewed with patient? Yes   SHORT TERM GOALS: Target date:09/10/22 Patient will be independent and compliant with initial HEP.  Baseline: issued at eval  Goal status: met   2.  Patient will demonstrate at least 4-/5 Rt hip flexor strength to improve ease of getting in and out of her bed.  Baseline: see above  Goal status: met   3.  Patient will demonstrate at least 90 degrees of Rt hip flexion AROM to improve ability to don/doff shoes and socks. Baseline: see above Goal status: met     LONG TERM GOALS: Target date: 10/08/22   Patient will complete TUG in </= 12 seconds to reduce her fall risk.  Baseline: see above  Goal status: met   2.  Patient will complete 5 x STS in </= 12 seconds to improve ease of transfers Baseline: see above  Goal status: met   3.  Patient will ambulate household and community distance with LRAD Baseline: currently using RW; 08/26/22: [place] currently  Goal  status: ongoing   4.  Patient will score at least 60/80 on the LEFS to signify clinically meaningful improvement in functional abilities.  Baseline: 13/80; 48/80 08/26/22 Goal status: revised   5. Patient will demonstrate at least 4+/5 gross Rt hip strength to improve stability with walking and standing activity.  Baseline: see above Goal status: initial   6. Patient will report pain as </=3/10 to reduce her current functional limitations. Baseline: 5/10 at worst Goal status: initial       PLAN: PT FREQUENCY: 2x/week   PT DURATION: 8 weeks   PLANNED INTERVENTIONS: Therapeutic exercises, Therapeutic activity, Neuromuscular re-education, Balance training, Gait training, Patient/Family education, Self Care, Stair training, DME instructions, Dry Needling, Electrical stimulation, Cryotherapy, Moist heat, Taping, Manual  therapy, and Re-evaluation   PLAN FOR NEXT SESSION: review HEP, manual to hip, progress strengthening as tolerated   Gwendolyn Grant, PT, DPT, ATC 09/02/22 12:25 PM

## 2022-09-04 ENCOUNTER — Ambulatory Visit: Payer: Medicaid Other

## 2022-09-04 DIAGNOSIS — M25551 Pain in right hip: Secondary | ICD-10-CM

## 2022-09-04 DIAGNOSIS — M25651 Stiffness of right hip, not elsewhere classified: Secondary | ICD-10-CM

## 2022-09-04 DIAGNOSIS — M6281 Muscle weakness (generalized): Secondary | ICD-10-CM

## 2022-09-04 DIAGNOSIS — R2689 Other abnormalities of gait and mobility: Secondary | ICD-10-CM

## 2022-09-04 NOTE — Therapy (Signed)
OUTPATIENT PHYSICAL THERAPY TREATMENT NOTE   Patient Name: Grace Carson MRN: [id number] DOB:[birth year], 46 y.o., female 89 Date: 09/04/2022  PCP: Trey Sailors, [place] REFERRING PROVIDER: Renette Butters, MD   END OF SESSION:   PT End of Session - 09/04/22 1018     Visit Number 7    Number of Visits 17    Date for PT Re-Evaluation 10/10/22    Authorization Type MCD    Authorization Time Period 09/03/22-10/14/22    Authorization - Visit Number 1    Authorization - Number of Visits 12    PT Start Time 1017    PT Stop Time 1057    PT Time Calculation (min) 40 min    Activity Tolerance Patient tolerated treatment well    Behavior During Therapy WFL for tasks assessed/performed                 Past Medical History:  Diagnosis Date   Arthritis    Bipolar 1 disorder ([place])    Depression    Post traumatic stress disorder (PTSD)    Social anxiety disorder    Past Surgical History:  Procedure Laterality Date   CESAREAN SECTION     TONSILLECTOMY     TOOTH EXTRACTION     TOTAL HIP ARTHROPLASTY Right 08/11/2022   Procedure: TOTAL HIP ARTHROPLASTY ANTERIOR APPROACH;  Surgeon: Renette Butters, MD;  Location: [facility];  Service: Orthopedics;  Laterality: Right;   Patient Active Problem List   Diagnosis Date Noted   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   PAIN:  Are you having pain? Yes: NPRS scale: 3/10 Pain location: Rt hip Pain description: dull;ache; sore Aggravating factors:  movement Relieving factors: rest   OBJECTIVE: (objective measures completed at initial evaluation unless otherwise dated)  DIAGNOSTIC FINDINGS:  IMPRESSION: Intraoperative fluoroscopic images of the right hip demonstrate total arthroplasty. No obvious perihardware fracture or component malpositioning.   PATIENT SURVEYS:  LEFS 13/80 08/26/22: 48/80   COGNITION:           Overall cognitive status: Within functional limits for tasks assessed                          SENSATION: Not tested   EDEMA:  N/A     POSTURE: rounded shoulders, forward head, and flexed trunk    PALPATION: Not assessed    LOWER EXTREMITY ROM:   Active ROM Right eval Left eval 08/26/22 Right  Hip flexion 80   92  Hip extension       Hip abduction       Hip adduction       Hip internal rotation       Hip external rotation       Knee flexion       Knee extension       Ankle dorsiflexion       Ankle plantarflexion       Ankle inversion       Ankle eversion        (  Blank rows = not tested)   LOWER EXTREMITY MMT:   MMT Right eval Left eval 08/26/22 09/04/22  Hip flexion 3- 4+ Rt: 4-/5 Rt: 4+/5   Hip extension     Rt: 3-/5   Hip abduction     Rt: 3+/5    Hip adduction        Hip internal rotation        Hip external rotation        Knee flexion 4+ 5 Rt: 5/5   Knee extension 4 5 Rt: 5/5   Ankle dorsiflexion        Ankle plantarflexion        Ankle inversion        Ankle eversion         (Blank rows = not tested)   Majority of Rt hip MMT deferred secondary to post-op acuity      FUNCTIONAL TESTS:  5 times sit to stand: 16.3 seconds; 08/26/22: 10 seconds  Timed up and go (TUG): 22 seconds RW; 08/21/22: 17.8 SPC; 08/26/22: 9.2 SPC   GAIT: Distance walked: 10 ft Assistive device utilized: Environmental consultant - 2 wheeled Level of assistance: Modified independence Comments: forward flexed posture, decreased hip extension RLE, decreased push-off   08/26/22: Mod I with SPC; decreased hip extension,  decreased push-off, forward flexed posture        TODAY'S TREATMENT: [facility] Adult PT Treatment:                                                DATE: 09/04/22 Therapeutic Exercise: NuStep level 6 x 5 minutes  Prone quad stretch x 60 sec  Resisted knee extension 3 x 10 @ 30 lbs  Resisted hamstring curl 2 x 10 @ 30 lbs  Leg press 3 x 10 @ 60 lbs  SL calf raise 2 x 10 each  Standing march 2 x 10  Standing abduction red band 2 x 10  Updated HEP  [facility] Adult PT Treatment:                                                DATE: 09/02/22 Therapeutic Exercise: NuStep level 5 x 5 minutes  Prone gluteal sets 2 x 10; 5 sec hold  Prone quad stretch x 60 sec each  Prone HS curl 2 x 10; green band  Resisted knee extension 3 x 10 @ 25 lbs  2 way hip (flexion/abduction 2 x 10 each) Sidelying hip abduction RLE 2 x 10  Leg press 3 x 10 @ 40 lbs  [facility] Adult PT Treatment:                                                DATE: 08/28/22 Therapeutic Exercise: NuStep level 5 x 5 minutes  Sidelying hip abduction Rt 2 x 10  Hip bridge 2 x 10  Quad sets 1 x 10; 5 sec hold  SLR attempted Heel slide with stability ball RLE 1 x 10; 5 sec hold Mini squats with UE support 2 x 10  Supine posterior pelvic tilts 2 x 10; 5  sec hold  Updated HEP Manual Therapy: PROM of Rt hip into all planes abiding by anterior hip precautions            PATIENT EDUCATION:  Education details: HEP Person educated: patient;partner  Education method: demo, cues, handout  Education comprehension: returned demo, cues     HOME EXERCISE PROGRAM: Access Code: [email or web address] URL: [email or web address] Date: 08/13/2022 Prepared by: Gwendolyn Grant   Exercises - Supine Quad Set  - 2 x daily - 7 x weekly - 2 sets - 10 reps - 5 sec  hold - Seated Long Arc Quad  - 2 x daily - 7 x weekly - 2 sets - 10 reps - 5 sec hold - Long Sitting Calf Stretch with Strap  - 2 x daily - 7 x weekly - 3 sets - 30 sec  hold - Supine Heel Slide  - 1  x daily - 7 x weekly - 1 sets - 10 reps - 5 sec hold - Sit to Stand  - 2 x daily - 7 x weekly - 1 sets - 5 reps   ASSESSMENT:   CLINICAL IMPRESSION: Patient is progressing well at 3.5 weeks s/p Rt THA. Continued with progression of hip and knee strengthening without reports of increased hip pain/soreness. She has difficulty maintaining lumbopelvic stability with SL standing activity with occasional hip drop and lateral trunk lean noted.   OBJECTIVE IMPAIRMENTS Abnormal gait, decreased activity tolerance, decreased balance, decreased mobility, difficulty walking, decreased ROM, decreased strength, improper body mechanics, postural dysfunction, and pain.    ACTIVITY LIMITATIONS carrying, lifting, bending, sitting, standing, squatting, sleeping, stairs, transfers, bed mobility, and locomotion level   PARTICIPATION LIMITATIONS: meal prep, cleaning, laundry, driving, shopping, community activity, and yard work   PERSONAL FACTORS Age and Fitness are also affecting patient's functional outcome.    REHAB POTENTIAL: Good   CLINICAL DECISION MAKING: Stable/uncomplicated   EVALUATION COMPLEXITY: Low     GOALS: Goals reviewed with patient? Yes   SHORT TERM GOALS: Target date:09/10/22 Patient will be independent and compliant with initial HEP.  Baseline: issued at eval  Goal status: met   2.  Patient will demonstrate at least 4-/5 Rt hip flexor strength to improve ease of getting in and out of her bed.  Baseline: see above  Goal status: met   3.  Patient will demonstrate at least 90 degrees of Rt hip flexion AROM to improve ability to don/doff shoes and socks. Baseline: see above Goal status: met     LONG TERM GOALS: Target date: 10/08/22   Patient will complete TUG in </= 12 seconds to reduce her fall risk.  Baseline: see above  Goal status: met   2.  Patient will complete 5 x STS in </= 12 seconds to improve ease of transfers Baseline: see above  Goal status: met   3.  Patient  will ambulate household and community distance with LRAD Baseline: currently using RW; 08/26/22: [place] currently  Goal status: ongoing   4.  Patient will score at least 60/80 on the LEFS to signify clinically meaningful improvement in functional abilities.  Baseline: 13/80; 48/80 08/26/22 Goal status: revised   5. Patient will demonstrate at least 4+/5 gross Rt hip strength to improve stability with walking and standing activity.  Baseline: see above Goal status: initial   6. Patient will report pain as </=3/10 to reduce her current functional limitations. Baseline: 5/10 at worst Goal status: initial       PLAN: PT FREQUENCY: 2x/week  PT DURATION: 8 weeks   PLANNED INTERVENTIONS: Therapeutic exercises, Therapeutic activity, Neuromuscular re-education, Balance training, Gait training, Patient/Family education, Self Care, Stair training, DME instructions, Dry Needling, Electrical stimulation, Cryotherapy, Moist heat, Taping, Manual therapy, and Re-evaluation   PLAN FOR NEXT SESSION: review HEP, manual to hip, progress strengthening as tolerated   Gwendolyn Grant, PT, DPT, ATC 09/04/22 10:57 AM

## 2022-09-09 ENCOUNTER — Ambulatory Visit: Payer: Medicaid Other

## 2022-09-09 DIAGNOSIS — M6281 Muscle weakness (generalized): Secondary | ICD-10-CM

## 2022-09-09 DIAGNOSIS — R2689 Other abnormalities of gait and mobility: Secondary | ICD-10-CM

## 2022-09-09 DIAGNOSIS — M25551 Pain in right hip: Secondary | ICD-10-CM | POA: Diagnosis not present

## 2022-09-09 DIAGNOSIS — M25651 Stiffness of right hip, not elsewhere classified: Secondary | ICD-10-CM

## 2022-09-09 NOTE — Therapy (Signed)
OUTPATIENT PHYSICAL THERAPY TREATMENT NOTE   Patient Name: Grace Carson MRN: [id number] DOB:[birth year], 46 y.o., female 88 Date: 09/09/2022  PCP: Trey Sailors, PA REFERRING PROVIDER: Renette Butters, MD   END OF SESSION:   PT End of Session - 09/09/22 1101     Visit Number 8    Number of Visits 17    Date for PT Re-Evaluation 10/10/22    Authorization Type MCD    Authorization Time Period 09/03/22-10/14/22    Authorization - Visit Number 2    Authorization - Number of Visits 12    PT Start Time 1100    PT Stop Time 1140    PT Time Calculation (min) 40 min    Activity Tolerance Patient tolerated treatment well    Behavior During Therapy WFL for tasks assessed/performed                 Past Medical History:  Diagnosis Date   Arthritis    Bipolar 1 disorder ([place])    Depression    Post traumatic stress disorder (PTSD)    Social anxiety disorder    Past Surgical History:  Procedure Laterality Date   CESAREAN SECTION     TONSILLECTOMY     TOOTH EXTRACTION     TOTAL HIP ARTHROPLASTY Right 08/11/2022   Procedure: TOTAL HIP ARTHROPLASTY ANTERIOR APPROACH;  Surgeon: Renette Butters, MD;  Location: [facility];  Service: Orthopedics;  Laterality: Right;   Patient Active Problem List   Diagnosis Date Noted   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 She reports HEP is going well, but sidelying hip abduction is still painful.   PAIN:  Are you having pain? None currently; at worst Yes: NPRS scale: 6/10 Pain  location: Rt hip Pain description: burning; tenderness Aggravating factors: movement Relieving factors: rest   OBJECTIVE: (objective measures completed at initial evaluation unless otherwise dated)  DIAGNOSTIC FINDINGS:  IMPRESSION: Intraoperative fluoroscopic images of the right hip demonstrate total arthroplasty. No obvious perihardware fracture or component malpositioning.   PATIENT SURVEYS:  LEFS 13/80 08/26/22: 48/80   COGNITION:           Overall cognitive status: Within functional limits for tasks assessed                          SENSATION: Not tested   EDEMA:  N/A     POSTURE: rounded shoulders, forward head, and flexed trunk    PALPATION: Not assessed    LOWER EXTREMITY ROM:   Active ROM Right eval Left eval 08/26/22 Right 09/09/22 Right   Hip flexion 80   92 102  Hip extension        Hip abduction        Hip adduction        Hip internal rotation        Hip external rotation        Knee flexion        Knee extension        Ankle dorsiflexion  Ankle plantarflexion        Ankle inversion        Ankle eversion         (Blank rows = not tested)   LOWER EXTREMITY MMT:   MMT Right eval Left eval 08/26/22 09/04/22  Hip flexion 3- 4+ Rt: 4-/5 Rt: 4+/5   Hip extension     Rt: 3-/5   Hip abduction     Rt: 3+/5    Hip adduction        Hip internal rotation        Hip external rotation        Knee flexion 4+ 5 Rt: 5/5   Knee extension 4 5 Rt: 5/5   Ankle dorsiflexion        Ankle plantarflexion        Ankle inversion        Ankle eversion         (Blank rows = not tested)   Majority of Rt hip MMT deferred secondary to post-op acuity      FUNCTIONAL TESTS:  5 times sit to stand: 16.3 seconds; 08/26/22: 10 seconds  Timed up and go (TUG): 22 seconds RW; 08/21/22: 17.8 SPC; 08/26/22: 9.2 SPC   GAIT: Distance walked: 10 ft Assistive device utilized: Environmental consultant - 2 wheeled Level of assistance: Modified independence Comments: forward flexed  posture, decreased hip extension RLE, decreased push-off   08/26/22: Mod I with SPC; decreased hip extension, decreased push-off, forward flexed posture        TODAY'S TREATMENT: [facility] Adult PT Treatment:                                                DATE: 09/09/22 Therapeutic Exercise: NuStep level 6 x 5 minutes UE/LE  Rt supine hip flexor stretch x 60 seconds  Partial range SLR 2 reps then has pain  Hip bridge with resisted abduction blue band 2 x 10  Squat to table 2 x 10  SL calf raise 2 x 10  Standing 2 way hip 2 x 10 flexion/abduction  Step ups 1 x 10 bilateral; 6 inch  Step ups knee driver 1 x 10 bilateral; 6 inch  Sidelying hip abduction 2 x 10  Updated HEP  [facility] Adult PT Treatment:                                                DATE: 09/04/22 Therapeutic Exercise: NuStep level 6 x 5 minutes  Prone quad stretch x 60 sec  Resisted knee extension 3 x 10 @ 30 lbs  Resisted hamstring curl 2 x 10 @ 30 lbs  Leg press 3 x 10 @ 60 lbs  SL calf raise 2 x 10 each  Standing march 2 x 10  Standing abduction red band 2 x 10  Updated HEP  [facility] Adult PT Treatment:                                                DATE: 09/02/22 Therapeutic Exercise: NuStep level 5 x 5 minutes  Prone gluteal sets 2 x  10; 5 sec hold  Prone quad stretch x 60 sec each  Prone HS curl 2 x 10; green band  Resisted knee extension 3 x 10 @ 25 lbs  2 way hip (flexion/abduction 2 x 10 each) Sidelying hip abduction RLE 2 x 10  Leg press 3 x 10 @ 40 lbs  [facility] Adult PT Treatment:                                                DATE: 08/28/22 Therapeutic Exercise: NuStep level 5 x 5 minutes  Sidelying hip abduction Rt 2 x 10  Hip bridge 2 x 10  Quad sets 1 x 10; 5 sec hold  SLR attempted Heel slide with stability ball RLE 1 x 10; 5 sec hold Mini squats with UE support 2 x 10  Supine posterior pelvic tilts 2 x 10; 5 sec hold  Updated HEP Manual Therapy: PROM of Rt hip into all planes abiding by anterior hip  precautions            PATIENT EDUCATION:  Education details: HEP Person educated: patient;partner  Education method: demo, cues, handout  Education comprehension: returned demo, cues     HOME EXERCISE PROGRAM: Access Code: [email or web address] URL: [email or web address] Date: 08/13/2022 Prepared by: Gwendolyn Grant   Exercises - Supine Quad Set  - 2 x daily - 7 x weekly - 2 sets - 10 reps - 5 sec  hold - Seated Long Arc Quad  - 2 x daily - 7 x weekly - 2 sets - 10 reps - 5 sec hold - Long Sitting Calf Stretch with Strap  - 2 x daily - 7 x weekly - 3 sets - 30 sec  hold - Supine Heel Slide  - 1 x daily - 7 x weekly - 1 sets - 10 reps - 5 sec hold - Sit to Stand  - 2 x daily - 7 x weekly - 1 sets - 5 reps   ASSESSMENT:   CLINICAL IMPRESSION: Patient is progressing well at 4 weeks s/p Rt THA. She was able to complete minimal reps of partial range SLR with no signs of quad lag, but after 2 reps she reported increased pain/discomfort about anterior hip and was unable to control the motion, so this was discontinued. She demonstrates improved stability with standing hip flexion, but remains challenged with standing hip abduction having difficulty controlling for hip drop and lateral trunk lean bilaterally. Besides SLR, no reports of hip pain throughout session.   OBJECTIVE IMPAIRMENTS Abnormal gait, decreased activity tolerance, decreased balance, decreased mobility, difficulty walking, decreased ROM, decreased strength, improper body mechanics, postural dysfunction, and pain.    ACTIVITY LIMITATIONS carrying, lifting, bending, sitting, standing, squatting, sleeping, stairs, transfers, bed mobility, and locomotion level   PARTICIPATION LIMITATIONS: meal prep, cleaning, laundry, driving, shopping, community activity, and yard work   PERSONAL FACTORS Age and Fitness are also affecting patient's functional outcome.    REHAB POTENTIAL: Good   CLINICAL DECISION MAKING:  Stable/uncomplicated   EVALUATION COMPLEXITY: Low     GOALS: Goals reviewed with patient? Yes   SHORT TERM GOALS: Target date:09/10/22 Patient will be independent and compliant with initial HEP.  Baseline: issued at eval  Goal status: met   2.  Patient will demonstrate at least 4-/5 Rt hip flexor strength to improve ease of getting in and out of  her bed.  Baseline: see above  Goal status: met   3.  Patient will demonstrate at least 90 degrees of Rt hip flexion AROM to improve ability to don/doff shoes and socks. Baseline: see above Goal status: met     LONG TERM GOALS: Target date: 10/08/22   Patient will complete TUG in </= 12 seconds to reduce her fall risk.  Baseline: see above  Goal status: met   2.  Patient will complete 5 x STS in </= 12 seconds to improve ease of transfers Baseline: see above  Goal status: met   3.  Patient will ambulate household and community distance with LRAD Baseline: currently using RW; 08/26/22: [place] currently  Goal status: ongoing   4.  Patient will score at least 60/80 on the LEFS to signify clinically meaningful improvement in functional abilities.  Baseline: 13/80; 48/80 08/26/22 Goal status: revised   5. Patient will demonstrate at least 4+/5 gross Rt hip strength to improve stability with walking and standing activity.  Baseline: see above Goal status: initial   6. Patient will report pain as </=3/10 to reduce her current functional limitations. Baseline: 5/10 at worst Goal status: initial       PLAN: PT FREQUENCY: 2x/week   PT DURATION: 8 weeks   PLANNED INTERVENTIONS: Therapeutic exercises, Therapeutic activity, Neuromuscular re-education, Balance training, Gait training, Patient/Family education, Self Care, Stair training, DME instructions, Dry Needling, Electrical stimulation, Cryotherapy, Moist heat, Taping, Manual therapy, and Re-evaluation   PLAN FOR NEXT SESSION: review HEP, manual to hip, progress strengthening as  tolerated   Gwendolyn Grant, PT, DPT, ATC 09/09/22 11:43 AM

## 2022-09-11 ENCOUNTER — Ambulatory Visit: Payer: Medicaid Other

## 2022-09-11 DIAGNOSIS — M25651 Stiffness of right hip, not elsewhere classified: Secondary | ICD-10-CM

## 2022-09-11 DIAGNOSIS — M6281 Muscle weakness (generalized): Secondary | ICD-10-CM

## 2022-09-11 DIAGNOSIS — M25551 Pain in right hip: Secondary | ICD-10-CM | POA: Diagnosis not present

## 2022-09-11 DIAGNOSIS — R2689 Other abnormalities of gait and mobility: Secondary | ICD-10-CM

## 2022-09-11 NOTE — Therapy (Signed)
OUTPATIENT PHYSICAL THERAPY TREATMENT NOTE   Patient Name: Grace Carson MRN: [id number] DOB:25-Feb-1976, 46 y.o., female 47 Date: 09/11/2022  PCP: Trey Sailors, PA REFERRING PROVIDER: Renette Butters, MD   END OF SESSION:   PT End of Session - 09/11/22 1018     Visit Number 9    Number of Visits 17    Date for PT Re-Evaluation 10/10/22    Authorization Type MCD    Authorization Time Period 09/03/22-10/14/22    Authorization - Visit Number 3    Authorization - Number of Visits 12    PT Start Time 1017    PT Stop Time 1058    PT Time Calculation (min) 41 min    Activity Tolerance Patient tolerated treatment well    Behavior During Therapy WFL for tasks assessed/performed                 Past Medical History:  Diagnosis Date   Arthritis    Bipolar 1 disorder ([place])    Depression    Post traumatic stress disorder (PTSD)    Social anxiety disorder    Past Surgical History:  Procedure Laterality Date   CESAREAN SECTION     TONSILLECTOMY     TOOTH EXTRACTION     TOTAL HIP ARTHROPLASTY Right 08/11/2022   Procedure: TOTAL HIP ARTHROPLASTY ANTERIOR APPROACH;  Surgeon: Renette Butters, MD;  Location: [facility];  Service: Orthopedics;  Laterality: Right;   Patient Active Problem List   Diagnosis Date Noted   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 It is feeling a little bit better today, but is still painful.   PAIN:  Are you having pain?  Yes: NPRS scale: 5/10 Pain location: Rt hip Pain description: sharp;gnawing Aggravating factors: walking Relieving factors: rest   OBJECTIVE: (objective measures completed at initial evaluation unless otherwise dated)  DIAGNOSTIC FINDINGS:  IMPRESSION: Intraoperative fluoroscopic images of the right hip demonstrate total arthroplasty. No obvious perihardware fracture or component malpositioning.   PATIENT SURVEYS:  LEFS 13/80 08/26/22: 48/80   COGNITION:           Overall cognitive status: Within functional limits for tasks assessed                          SENSATION: Not tested   EDEMA:  N/A     POSTURE: rounded shoulders, forward head, and flexed trunk    PALPATION: Not assessed    LOWER EXTREMITY ROM:   Active ROM Right eval Left eval 08/26/22 Right 09/09/22 Right   Hip flexion 80   92 102  Hip extension        Hip abduction        Hip adduction        Hip internal rotation        Hip external rotation        Knee flexion        Knee extension  Ankle dorsiflexion        Ankle plantarflexion        Ankle inversion        Ankle eversion         (Blank rows = not tested)   LOWER EXTREMITY MMT:   MMT Right eval Left eval 08/26/22 09/04/22  Hip flexion 3- 4+ Rt: 4-/5 Rt: 4+/5   Hip extension     Rt: 3-/5   Hip abduction     Rt: 3+/5    Hip adduction        Hip internal rotation        Hip external rotation        Knee flexion 4+ 5 Rt: 5/5   Knee extension 4 5 Rt: 5/5   Ankle dorsiflexion        Ankle plantarflexion        Ankle inversion        Ankle eversion         (Blank rows = not tested)   Majority of Rt hip MMT deferred secondary to post-op acuity      FUNCTIONAL TESTS:  5 times sit to stand: 16.3 seconds; 08/26/22: 10 seconds  Timed up and go (TUG): 22 seconds RW; 08/21/22: 17.8 SPC; 08/26/22: 9.2 SPC   GAIT: Distance walked: 10 ft Assistive device utilized: Environmental consultant - 2 wheeled Level of assistance: Modified independence Comments:  forward flexed posture, decreased hip extension RLE, decreased push-off   08/26/22: Mod I with SPC; decreased hip extension, decreased push-off, forward flexed posture        TODAY'S TREATMENT: [facility] Adult PT Treatment:                                                DATE: 09/11/22 Therapeutic Exercise: NuStep level 6 x 5 minutes UE/LE  Leg press 1 x 10 @ 60 lbs; 1 x 10 @ 80 lbs  Resisted knee extension 3 x 10 @ 35 lbs  Resisted hamstring curl 3 x 10 @ 35 lbs  Manual Therapy: Rt hip PROM to tolerance all planes  NMR: Tandem on airex 3 x 30 sec each Romberg eyes closed on airex 3 x 30 sec  Balance on BOSU 2 x30 sec   [facility] Adult PT Treatment:                                                DATE: 09/09/22 Therapeutic Exercise: NuStep level 6 x 5 minutes UE/LE  Rt supine hip flexor stretch x 60 seconds  Partial range SLR 2 reps then has pain  Hip bridge with resisted abduction blue band 2 x 10  Squat to table 2 x 10  SL calf raise 2 x 10  Standing 2 way hip 2 x 10 flexion/abduction  Step ups 1 x 10 bilateral; 6 inch  Step ups knee driver 1 x 10 bilateral; 6 inch  Sidelying hip abduction 2 x 10  Updated HEP  [facility] Adult PT Treatment:  DATE: 09/04/22 Therapeutic Exercise: NuStep level 6 x 5 minutes  Prone quad stretch x 60 sec  Resisted knee extension 3 x 10 @ 30 lbs  Resisted hamstring curl 2 x 10 @ 30 lbs  Leg press 3 x 10 @ 60 lbs  SL calf raise 2 x 10 each  Standing march 2 x 10  Standing abduction red band 2 x 10  Updated HEP            PATIENT EDUCATION:  Education details: N/A Person educated: N/A Education method: N/A Education comprehension: N/A     HOME EXERCISE PROGRAM: Access Code: [email or web address] URL: [email or web address] Date: 08/13/2022 Prepared by: Gwendolyn Grant   Exercises - Supine Quad Set  - 2 x daily - 7 x weekly - 2 sets - 10 reps - 5 sec  hold - Seated Long Arc Quad  - 2 x daily - 7 x  weekly - 2 sets - 10 reps - 5 sec hold - Long Sitting Calf Stretch with Strap  - 2 x daily - 7 x weekly - 3 sets - 30 sec  hold - Supine Heel Slide  - 1 x daily - 7 x weekly - 1 sets - 10 reps - 5 sec hold - Sit to Stand  - 2 x daily - 7 x weekly - 1 sets - 5 reps   ASSESSMENT:   CLINICAL IMPRESSION: Patient arrives with increased hip pain that has been ongoing since yesterday morning. Good tolerance to PROM with patient reporting discomfort at end range of flexion and IR. Able to progress hip and knee strengthening today without an increase in hip pain. Began static balance training today with patient demonstrating good postural control on unstable surface. She reported a reduction in her hip pain at conclusion of session.   OBJECTIVE IMPAIRMENTS Abnormal gait, decreased activity tolerance, decreased balance, decreased mobility, difficulty walking, decreased ROM, decreased strength, improper body mechanics, postural dysfunction, and pain.    ACTIVITY LIMITATIONS carrying, lifting, bending, sitting, standing, squatting, sleeping, stairs, transfers, bed mobility, and locomotion level   PARTICIPATION LIMITATIONS: meal prep, cleaning, laundry, driving, shopping, community activity, and yard work   PERSONAL FACTORS Age and Fitness are also affecting patient's functional outcome.    REHAB POTENTIAL: Good   CLINICAL DECISION MAKING: Stable/uncomplicated   EVALUATION COMPLEXITY: Low     GOALS: Goals reviewed with patient? Yes   SHORT TERM GOALS: Target date:09/10/22 Patient will be independent and compliant with initial HEP.  Baseline: issued at eval  Goal status: met   2.  Patient will demonstrate at least 4-/5 Rt hip flexor strength to improve ease of getting in and out of her bed.  Baseline: see above  Goal status: met   3.  Patient will demonstrate at least 90 degrees of Rt hip flexion AROM to improve ability to don/doff shoes and socks. Baseline: see above Goal status: met      LONG TERM GOALS: Target date: 10/08/22   Patient will complete TUG in </= 12 seconds to reduce her fall risk.  Baseline: see above  Goal status: met   2.  Patient will complete 5 x STS in </= 12 seconds to improve ease of transfers Baseline: see above  Goal status: met   3.  Patient will ambulate household and community distance with LRAD Baseline: currently using RW; 08/26/22: [place] currently  Goal status: ongoing   4.  Patient will score at least 60/80 on the LEFS to signify clinically meaningful improvement in functional  abilities.  Baseline: 13/80; 48/80 08/26/22 Goal status: revised   5. Patient will demonstrate at least 4+/5 gross Rt hip strength to improve stability with walking and standing activity.  Baseline: see above Goal status: initial   6. Patient will report pain as </=3/10 to reduce her current functional limitations. Baseline: 5/10 at worst Goal status: initial       PLAN: PT FREQUENCY: 2x/week   PT DURATION: 8 weeks   PLANNED INTERVENTIONS: Therapeutic exercises, Therapeutic activity, Neuromuscular re-education, Balance training, Gait training, Patient/Family education, Self Care, Stair training, DME instructions, Dry Needling, Electrical stimulation, Cryotherapy, Moist heat, Taping, Manual therapy, and Re-evaluation   PLAN FOR NEXT SESSION: review HEP, manual to hip, progress strengthening as tolerated   Gwendolyn Grant, PT, DPT, ATC 09/11/22 11:01 AM

## 2022-09-16 ENCOUNTER — Ambulatory Visit: Payer: Medicaid Other

## 2022-09-16 DIAGNOSIS — M25551 Pain in right hip: Secondary | ICD-10-CM | POA: Diagnosis not present

## 2022-09-16 DIAGNOSIS — R2689 Other abnormalities of gait and mobility: Secondary | ICD-10-CM

## 2022-09-16 DIAGNOSIS — M25651 Stiffness of right hip, not elsewhere classified: Secondary | ICD-10-CM

## 2022-09-16 DIAGNOSIS — M6281 Muscle weakness (generalized): Secondary | ICD-10-CM

## 2022-09-16 NOTE — Therapy (Signed)
OUTPATIENT PHYSICAL THERAPY TREATMENT NOTE   Patient Name: Grace Carson MRN: [id number] DOB:[birth year], 46 y.o., female 61 Date: 09/16/2022  PCP: Trey Sailors, [place] REFERRING PROVIDER: Renette Butters, MD   END OF SESSION:   PT End of Session - 09/16/22 1018     Visit Number 10    Number of Visits 17    Date for PT Re-Evaluation 10/10/22    Authorization Type MCD    Authorization Time Period 09/03/22-10/14/22    Authorization - Visit Number 4    Authorization - Number of Visits 12    PT Start Time 1017    PT Stop Time 1056    PT Time Calculation (min) 39 min    Activity Tolerance Patient tolerated treatment well    Behavior During Therapy WFL for tasks assessed/performed                  Past Medical History:  Diagnosis Date   Arthritis    Bipolar 1 disorder ([place])    Depression    Post traumatic stress disorder (PTSD)    Social anxiety disorder    Past Surgical History:  Procedure Laterality Date   CESAREAN SECTION     TONSILLECTOMY     TOOTH EXTRACTION     TOTAL HIP ARTHROPLASTY Right 08/11/2022   Procedure: TOTAL HIP ARTHROPLASTY ANTERIOR APPROACH;  Surgeon: Renette Butters, MD;  Location: [facility];  Service: Orthopedics;  Laterality: Right;   Patient Active Problem List   Diagnosis Date Noted   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   PAIN:  Are you having pain?None currently at worst Yes: NPRS scale: 2/10 Pain location: Rt hip Pain  description: sore; sharp Aggravating factors: walking Relieving factors: rest   OBJECTIVE: (objective measures completed at initial evaluation unless otherwise dated)  DIAGNOSTIC FINDINGS:  IMPRESSION: Intraoperative fluoroscopic images of the right hip demonstrate total arthroplasty. No obvious perihardware fracture or component malpositioning.   PATIENT SURVEYS:  LEFS 13/80 08/26/22: 48/80   COGNITION:           Overall cognitive status: Within functional limits for tasks assessed                          SENSATION: Not tested   EDEMA:  N/A     POSTURE: rounded shoulders, forward head, and flexed trunk    PALPATION: Not assessed    LOWER EXTREMITY ROM:   Active ROM Right eval Left eval 08/26/22 Right 09/09/22 Right   Hip flexion 80   92 102  Hip extension        Hip abduction        Hip adduction        Hip internal rotation        Hip external rotation        Knee flexion        Knee extension        Ankle dorsiflexion        Ankle plantarflexion  Ankle inversion        Ankle eversion         (Blank rows = not tested)   LOWER EXTREMITY MMT:   MMT Right eval Left eval 08/26/22 09/04/22 09/16/22  Hip flexion 3- 4+ Rt: 4-/5 Rt: 4+/5    Hip extension     Rt: 3-/5    Hip abduction     Rt: 3+/5   Rt: 4/5   Hip adduction         Hip internal rotation         Hip external rotation         Knee flexion 4+ 5 Rt: 5/5    Knee extension 4 5 Rt: 5/5    Ankle dorsiflexion         Ankle plantarflexion         Ankle inversion         Ankle eversion          (Blank rows = not tested)   Majority of Rt hip MMT deferred secondary to post-op acuity      FUNCTIONAL TESTS:  5 times sit to stand: 16.3 seconds; 08/26/22: 10 seconds  Timed up and go (TUG): 22 seconds RW; 08/21/22: 17.8 SPC; 08/26/22: 9.2 SPC   GAIT: Distance walked: 10 ft Assistive device utilized: Environmental consultant - 2 wheeled Level of assistance: Modified independence Comments: forward flexed posture,  decreased hip extension RLE, decreased push-off   08/26/22: Mod I with SPC; decreased hip extension, decreased push-off, forward flexed posture        TODAY'S TREATMENT: [facility] Adult PT Treatment:                                                DATE: 09/16/22 Therapeutic Exercise: NuStep level 6 x 5 minutes UE/LE Leg press 1 x 10 @ 80 lbs; 2 x 10 @ 100 lbs  Lateral band walks green band at shins 4 x 25 ft  Forward band walks green band at shins 4 x 25 ft  Backward band walks green band at shins 4 x 25 ft  Deadlift with green band 2 x 10  Opposite touchdown to cone 10 cones 2 x 10 each   [facility] Adult PT Treatment:                                                DATE: 09/11/22 Therapeutic Exercise: NuStep level 6 x 5 minutes UE/LE  Leg press 1 x 10 @ 60 lbs; 1 x 10 @ 80 lbs  Resisted knee extension 3 x 10 @ 35 lbs  Resisted hamstring curl 3 x 10 @ 35 lbs  Manual Therapy: Rt hip PROM to tolerance all planes  NMR: Tandem on airex 3 x 30 sec each Romberg eyes closed on airex 3 x 30 sec  Balance on BOSU 2 x30 sec   [facility] Adult PT Treatment:                                                DATE: 09/09/22 Therapeutic Exercise: NuStep level 6 x 5 minutes  UE/LE  Rt supine hip flexor stretch x 60 seconds  Partial range SLR 2 reps then has pain  Hip bridge with resisted abduction blue band 2 x 10  Squat to table 2 x 10  SL calf raise 2 x 10  Standing 2 way hip 2 x 10 flexion/abduction  Step ups 1 x 10 bilateral; 6 inch  Step ups knee driver 1 x 10 bilateral; 6 inch  Sidelying hip abduction 2 x 10  Updated HEP             PATIENT EDUCATION:  Education details: N/A Person educated: N/A Education method: N/A Education comprehension: N/A     HOME EXERCISE PROGRAM: Access Code: [email or web address] URL: [email or web address] Date: 08/13/2022 Prepared by: Gwendolyn Grant   Exercises - Supine Quad Set  - 2 x daily - 7 x weekly - 2 sets - 10 reps - 5 sec  hold - Seated Long Arc Quad   - 2 x daily - 7 x weekly - 2 sets - 10 reps - 5 sec hold - Long Sitting Calf Stretch with Strap  - 2 x daily - 7 x weekly - 3 sets - 30 sec  hold - Supine Heel Slide  - 1 x daily - 7 x weekly - 1 sets - 10 reps - 5 sec hold - Sit to Stand  - 2 x daily - 7 x weekly - 1 sets - 5 reps   ASSESSMENT:   CLINICAL IMPRESSION: Patient is progressing well 5 weeks s/p Rt THA. She demonstrates improved hip abductor strength compared to last assessment with mild weakness remaining. She tolerated session well today with further progression of LE strength and balance. She has difficulty with proper sequencing of deadlift having difficulty activating gluteals for ascent of movement. Progressed to dynamic SL balance activity with patient having difficulty maintaining balance on the RLE. No reports of hip pain throughout session.   OBJECTIVE IMPAIRMENTS Abnormal gait, decreased activity tolerance, decreased balance, decreased mobility, difficulty walking, decreased ROM, decreased strength, improper body mechanics, postural dysfunction, and pain.    ACTIVITY LIMITATIONS carrying, lifting, bending, sitting, standing, squatting, sleeping, stairs, transfers, bed mobility, and locomotion level   PARTICIPATION LIMITATIONS: meal prep, cleaning, laundry, driving, shopping, community activity, and yard work   PERSONAL FACTORS Age and Fitness are also affecting patient's functional outcome.    REHAB POTENTIAL: Good   CLINICAL DECISION MAKING: Stable/uncomplicated   EVALUATION COMPLEXITY: Low     GOALS: Goals reviewed with patient? Yes   SHORT TERM GOALS: Target date:09/10/22 Patient will be independent and compliant with initial HEP.  Baseline: issued at eval  Goal status: met   2.  Patient will demonstrate at least 4-/5 Rt hip flexor strength to improve ease of getting in and out of her bed.  Baseline: see above  Goal status: met   3.  Patient will demonstrate at least 90 degrees of Rt hip flexion AROM to  improve ability to don/doff shoes and socks. Baseline: see above Goal status: met     LONG TERM GOALS: Target date: 10/08/22   Patient will complete TUG in </= 12 seconds to reduce her fall risk.  Baseline: see above  Goal status: met   2.  Patient will complete 5 x STS in </= 12 seconds to improve ease of transfers Baseline: see above  Goal status: met   3.  Patient will ambulate household and community distance with LRAD Baseline: currently using RW; 08/26/22: [place] currently  Goal status: ongoing  4.  Patient will score at least 60/80 on the LEFS to signify clinically meaningful improvement in functional abilities.  Baseline: 13/80; 48/80 08/26/22 Goal status: revised   5. Patient will demonstrate at least 4+/5 gross Rt hip strength to improve stability with walking and standing activity.  Baseline: see above Goal status: initial   6. Patient will report pain as </=3/10 to reduce her current functional limitations. Baseline: 5/10 at worst Goal status: initial       PLAN: PT FREQUENCY: 2x/week   PT DURATION: 8 weeks   PLANNED INTERVENTIONS: Therapeutic exercises, Therapeutic activity, Neuromuscular re-education, Balance training, Gait training, Patient/Family education, Self Care, Stair training, DME instructions, Dry Needling, Electrical stimulation, Cryotherapy, Moist heat, Taping, Manual therapy, and Re-evaluation   PLAN FOR NEXT SESSION: review HEP, manual to hip, progress strengthening as tolerated   Gwendolyn Grant, PT, DPT, ATC 09/16/22 10:57 AM

## 2022-09-17 NOTE — Therapy (Signed)
OUTPATIENT PHYSICAL THERAPY TREATMENT NOTE   Patient Name: Grace Carson MRN: [id number] DOB:12-10-1976, 46 y.o., female 16 Date: 09/18/2022  PCP: Trey Sailors, [place] REFERRING PROVIDER: Renette Butters, MD   END OF SESSION:   PT End of Session - 09/18/22 1015     Visit Number 11    Number of Visits 17    Date for PT Re-Evaluation 10/10/22    Authorization Type MCD    Authorization Time Period 09/03/22-10/14/22    Authorization - Visit Number 5    Authorization - Number of Visits 12    PT Start Time 1015    PT Stop Time 1057    PT Time Calculation (min) 42 min    Activity Tolerance Patient tolerated treatment well    Behavior During Therapy WFL for tasks assessed/performed                   Past Medical History:  Diagnosis Date   Arthritis    Bipolar 1 disorder ([place])    Depression    Post traumatic stress disorder (PTSD)    Social anxiety disorder    Past Surgical History:  Procedure Laterality Date   CESAREAN SECTION     TONSILLECTOMY     TOOTH EXTRACTION     TOTAL HIP ARTHROPLASTY Right 08/11/2022   Procedure: TOTAL HIP ARTHROPLASTY ANTERIOR APPROACH;  Surgeon: Renette Butters, MD;  Location: [facility];  Service: Orthopedics;  Laterality: Right;   Patient Active Problem List   Diagnosis Date Noted   S/P total hip arthroplasty 08/11/2022    REFERRING DIAG: POST OP RIGHT TOTAL HIP REPLACEMENT 08/11/22   THERAPY DIAG:  Pain in right hip  Stiffness of right hip, not elsewhere classified  Muscle weakness (generalized)  Other abnormalities of gait and mobility  Rationale for Evaluation and Treatment Rehabilitation  PERTINENT HISTORY:  Rt THA 08/11/22 Arthritis Bipolar PTSD Social anxiety disorder   PRECAUTIONS: Anterior hip  SUBJECTIVE: Patient reports muscle soreness in both legs that has been ongoing since last session. No pain.   PAIN:  Are you having pain?None currently at worst Yes: NPRS scale: 5/10 Pain location: Rt  hip Pain description: sore; sharp Aggravating factors: walking Relieving factors: rest   OBJECTIVE: (objective measures completed at initial evaluation unless otherwise dated)  DIAGNOSTIC FINDINGS:  IMPRESSION: Intraoperative fluoroscopic images of the right hip demonstrate total arthroplasty. No obvious perihardware fracture or component malpositioning.   PATIENT SURVEYS:  LEFS 13/80 08/26/22: 48/80   COGNITION:           Overall cognitive status: Within functional limits for tasks assessed                          SENSATION: Not tested   EDEMA:  N/A     POSTURE: rounded shoulders, forward head, and flexed trunk    PALPATION: Not assessed    LOWER EXTREMITY ROM:   Active ROM Right eval Left eval 08/26/22 Right 09/09/22 Right   Hip flexion 80   92 102  Hip extension        Hip abduction        Hip adduction        Hip internal rotation        Hip external rotation        Knee flexion        Knee extension        Ankle dorsiflexion  Ankle plantarflexion        Ankle inversion        Ankle eversion         (Blank rows = not tested)   LOWER EXTREMITY MMT:   MMT Right eval Left eval 08/26/22 09/04/22 09/16/22  Hip flexion 3- 4+ Rt: 4-/5 Rt: 4+/5    Hip extension     Rt: 3-/5    Hip abduction     Rt: 3+/5   Rt: 4/5   Hip adduction         Hip internal rotation         Hip external rotation         Knee flexion 4+ 5 Rt: 5/5    Knee extension 4 5 Rt: 5/5    Ankle dorsiflexion         Ankle plantarflexion         Ankle inversion         Ankle eversion          (Blank rows = not tested)   Majority of Rt hip MMT deferred secondary to post-op acuity      FUNCTIONAL TESTS:  5 times sit to stand: 16.3 seconds; 08/26/22: 10 seconds  Timed up and go (TUG): 22 seconds RW; 08/21/22: 17.8 SPC; 08/26/22: 9.2 SPC   GAIT: Distance walked: 10 ft Assistive device utilized: Environmental consultant - 2 wheeled Level of assistance: Modified independence Comments: forward flexed  posture, decreased hip extension RLE, decreased push-off   08/26/22: Mod I with SPC; decreased hip extension, decreased push-off, forward flexed posture        TODAY'S TREATMENT: [facility] Adult PT Treatment:                                                DATE: 09/18/22 Therapeutic Exercise: NuStep level 6 x 5 minutes UE/LE  Prone quad stretch x 60 sec each  Hooklying butterfly stretch x 60 sec  Hamstring stretch with strap 2 x 30 sec  Hip bridge with abduction black band 2 x 15  Sidelying hip abduction RLE 2 x 10  Updated HEP   NMR: SLS 3 x 30 sec each  Forward Step ups on bosu 1 x 10 each  Lateral step ups on bosu 1 x 10 each   [facility] Adult PT Treatment:                                                DATE: 09/16/22 Therapeutic Exercise: NuStep level 6 x 5 minutes UE/LE Leg press 1 x 10 @ 80 lbs; 2 x 10 @ 100 lbs  Lateral band walks green band at shins 4 x 25 ft  Forward band walks green band at shins 4 x 25 ft  Backward band walks green band at shins 4 x 25 ft  Deadlift with green band 2 x 10  Opposite touchdown to cone 10 cones 2 x 10 each   [facility] Adult PT Treatment:  DATE: 09/11/22 Therapeutic Exercise: NuStep level 6 x 5 minutes UE/LE  Leg press 1 x 10 @ 60 lbs; 1 x 10 @ 80 lbs  Resisted knee extension 3 x 10 @ 35 lbs  Resisted hamstring curl 3 x 10 @ 35 lbs  Manual Therapy: Rt hip PROM to tolerance all planes  NMR: Tandem on airex 3 x 30 sec each Romberg eyes closed on airex 3 x 30 sec  Balance on BOSU 2 x30 sec   [facility] Adult PT Treatment:                                                DATE: 09/09/22 Therapeutic Exercise: NuStep level 6 x 5 minutes UE/LE  Rt supine hip flexor stretch x 60 seconds  Partial range SLR 2 reps then has pain  Hip bridge with resisted abduction blue band 2 x 10  Squat to table 2 x 10  SL calf raise 2 x 10  Standing 2 way hip 2 x 10 flexion/abduction  Step ups 1 x 10 bilateral; 6 inch  Step ups knee  driver 1 x 10 bilateral; 6 inch  Sidelying hip abduction 2 x 10  Updated HEP             PATIENT EDUCATION:  Education details: HEP Person educated: patient;partner Education method: handout, demo, cues Education comprehension: returned demo, cues      HOME EXERCISE PROGRAM: Access Code: [email or web address] URL: [email or web address] Date: 08/13/2022 Prepared by: Gwendolyn Grant   Exercises - Supine Quad Set  - 2 x daily - 7 x weekly - 2 sets - 10 reps - 5 sec  hold - Seated Long Arc Quad  - 2 x daily - 7 x weekly - 2 sets - 10 reps - 5 sec hold - Long Sitting Calf Stretch with Strap  - 2 x daily - 7 x weekly - 3 sets - 30 sec  hold - Supine Heel Slide  - 1 x daily - 7 x weekly - 1 sets - 10 reps - 5 sec hold - Sit to Stand  - 2 x daily - 7 x weekly - 1 sets - 5 reps   ASSESSMENT:   CLINICAL IMPRESSION: Patient arrives with BLE muscle soreness that has been present since last session. Given continued soreness today's session focused more on improving flexibility and progression of balance activity. Some difficulty maintaining balance with SLS on the RLE, but overall demonstrates good lumbopelvic stability during SLS with no signs of pelvic drop. She reported a reduction in muscle soreness at conclusion of session.   OBJECTIVE IMPAIRMENTS Abnormal gait, decreased activity tolerance, decreased balance, decreased mobility, difficulty walking, decreased ROM, decreased strength, improper body mechanics, postural dysfunction, and pain.    ACTIVITY LIMITATIONS carrying, lifting, bending, sitting, standing, squatting, sleeping, stairs, transfers, bed mobility, and locomotion level   PARTICIPATION LIMITATIONS: meal prep, cleaning, laundry, driving, shopping, community activity, and yard work   PERSONAL FACTORS Age and Fitness are also affecting patient's functional outcome.    REHAB POTENTIAL: Good   CLINICAL DECISION MAKING: Stable/uncomplicated   EVALUATION COMPLEXITY: Low      GOALS: Goals reviewed with patient? Yes   SHORT TERM GOALS: Target date:09/10/22 Patient will be independent and compliant with initial HEP.  Baseline: issued at eval  Goal status: met   2.  Patient will demonstrate at least  4-/5 Rt hip flexor strength to improve ease of getting in and out of her bed.  Baseline: see above  Goal status: met   3.  Patient will demonstrate at least 90 degrees of Rt hip flexion AROM to improve ability to don/doff shoes and socks. Baseline: see above Goal status: met     LONG TERM GOALS: Target date: 10/08/22   Patient will complete TUG in </= 12 seconds to reduce her fall risk.  Baseline: see above  Goal status: met   2.  Patient will complete 5 x STS in </= 12 seconds to improve ease of transfers Baseline: see above  Goal status: met   3.  Patient will ambulate household and community distance with LRAD Baseline: currently using RW; 08/26/22: [place] currently  Goal status: ongoing   4.  Patient will score at least 60/80 on the LEFS to signify clinically meaningful improvement in functional abilities.  Baseline: 13/80; 48/80 08/26/22 Goal status: revised   5. Patient will demonstrate at least 4+/5 gross Rt hip strength to improve stability with walking and standing activity.  Baseline: see above Goal status: initial   6. Patient will report pain as </=3/10 to reduce her current functional limitations. Baseline: 5/10 at worst Goal status: initial       PLAN: PT FREQUENCY: 2x/week   PT DURATION: 8 weeks   PLANNED INTERVENTIONS: Therapeutic exercises, Therapeutic activity, Neuromuscular re-education, Balance training, Gait training, Patient/Family education, Self Care, Stair training, DME instructions, Dry Needling, Electrical stimulation, Cryotherapy, Moist heat, Taping, Manual therapy, and Re-evaluation   PLAN FOR NEXT SESSION: review HEP, manual to hip, progress strengthening as tolerated   Gwendolyn Grant, PT, DPT, ATC 09/18/22 10:57  AM

## 2022-09-18 ENCOUNTER — Ambulatory Visit: Payer: Medicaid Other

## 2022-09-18 DIAGNOSIS — M25651 Stiffness of right hip, not elsewhere classified: Secondary | ICD-10-CM

## 2022-09-18 DIAGNOSIS — M25551 Pain in right hip: Secondary | ICD-10-CM

## 2022-09-18 DIAGNOSIS — R2689 Other abnormalities of gait and mobility: Secondary | ICD-10-CM

## 2022-09-18 DIAGNOSIS — M6281 Muscle weakness (generalized): Secondary | ICD-10-CM

## 2022-09-23 ENCOUNTER — Ambulatory Visit: Payer: Medicaid Other

## 2022-09-23 DIAGNOSIS — M25551 Pain in right hip: Secondary | ICD-10-CM | POA: Diagnosis not present

## 2022-09-23 DIAGNOSIS — M6281 Muscle weakness (generalized): Secondary | ICD-10-CM

## 2022-09-23 DIAGNOSIS — M25651 Stiffness of right hip, not elsewhere classified: Secondary | ICD-10-CM

## 2022-09-23 DIAGNOSIS — R2689 Other abnormalities of gait and mobility: Secondary | ICD-10-CM

## 2022-09-23 NOTE — Therapy (Signed)
OUTPATIENT PHYSICAL THERAPY TREATMENT NOTE   Patient Name: Grace Carson MRN: [id number] DOB:[birth year], 46 y.o., female 34 Date: 09/23/2022  PCP: Trey Sailors, [place] REFERRING PROVIDER: Renette Butters, MD   END OF SESSION:   PT End of Session - 09/23/22 1145     Visit Number 12    Number of Visits 17    Date for PT Re-Evaluation 10/10/22    Authorization Type MCD    Authorization Time Period 09/03/22-10/14/22    Authorization - Visit Number 6    Authorization - Number of Visits 12    PT Start Time 0865    PT Stop Time 1225    PT Time Calculation (min) 40 min    Activity Tolerance Patient tolerated treatment well    Behavior During Therapy WFL for tasks assessed/performed                    Past Medical History:  Diagnosis Date   Arthritis    Bipolar 1 disorder ([place])    Depression    Post traumatic stress disorder (PTSD)    Social anxiety disorder    Past Surgical History:  Procedure Laterality Date   CESAREAN SECTION     TONSILLECTOMY     TOOTH EXTRACTION     TOTAL HIP ARTHROPLASTY Right 08/11/2022   Procedure: TOTAL HIP ARTHROPLASTY ANTERIOR APPROACH;  Surgeon: Renette Butters, MD;  Location: [facility];  Service: Orthopedics;  Laterality: Right;   Patient Active Problem List   Diagnosis Date Noted   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   PAIN:  Are you having pain?Yes: NPRS scale: 2/10 Pain location: Rt hip Pain description: dull;ache Aggravating factors:  walking Relieving factors: rest   OBJECTIVE: (objective measures completed at initial evaluation unless otherwise dated)  DIAGNOSTIC FINDINGS:  IMPRESSION: Intraoperative fluoroscopic images of the right hip demonstrate total arthroplasty. No obvious perihardware fracture or component malpositioning.   PATIENT SURVEYS:  LEFS 13/80 08/26/22: 48/80   COGNITION:           Overall cognitive status: Within functional limits for tasks assessed                          SENSATION: Not tested   EDEMA:  N/A     POSTURE: rounded shoulders, forward head, and flexed trunk    PALPATION: Not assessed    LOWER EXTREMITY ROM:   Active ROM Right eval Left eval 08/26/22 Right 09/09/22 Right   Hip flexion 80   92 102  Hip extension        Hip abduction        Hip adduction        Hip internal rotation        Hip external rotation        Knee flexion        Knee extension        Ankle dorsiflexion        Ankle plantarflexion        Ankle  inversion        Ankle eversion         (Blank rows = not tested)   LOWER EXTREMITY MMT:   MMT Right eval Left eval 08/26/22 09/04/22 09/16/22  Hip flexion 3- 4+ Rt: 4-/5 Rt: 4+/5    Hip extension     Rt: 3-/5    Hip abduction     Rt: 3+/5   Rt: 4/5   Hip adduction         Hip internal rotation         Hip external rotation         Knee flexion 4+ 5 Rt: 5/5    Knee extension 4 5 Rt: 5/5    Ankle dorsiflexion         Ankle plantarflexion         Ankle inversion         Ankle eversion          (Blank rows = not tested)   Majority of Rt hip MMT deferred secondary to post-op acuity      FUNCTIONAL TESTS:  5 times sit to stand: 16.3 seconds; 08/26/22: 10 seconds  Timed up and go (TUG): 22 seconds RW; 08/21/22: 17.8 SPC; 08/26/22: 9.2 SPC   GAIT: Distance walked: 10 ft Assistive device utilized: Environmental consultant - 2 wheeled Level of assistance: Modified independence Comments: forward flexed posture, decreased hip extension RLE, decreased push-off    08/26/22: Mod I with SPC; decreased hip extension, decreased push-off, forward flexed posture        TODAY'S TREATMENT: [facility] Adult PT Treatment:                                                DATE: 09/23/22 Therapeutic Exercise: NuStep level 6 x 5 minutes UE/LE Prone hip extension 2 x 10  Backwards walking 4 x 20 ft  Squat to table 2 x 10 ; 15 lbs  Quadruped leg extension 2 x 10  Fire hydrant 2 x 10  Standing march to triple extension 1 x 10 each  SLR x 2 (d/c due to pain)    [facility] Adult PT Treatment:                                                DATE: 09/18/22 Therapeutic Exercise: NuStep level 6 x 5 minutes UE/LE  Prone quad stretch x 60 sec each  Hooklying butterfly stretch x 60 sec  Hamstring stretch with strap 2 x 30 sec  Hip bridge with abduction black band 2 x 15  Sidelying hip abduction RLE 2 x 10  Updated HEP   NMR: SLS 3 x 30 sec each  Forward Step ups on bosu 1 x 10 each  Lateral step ups on bosu 1 x 10 each   [facility] Adult PT Treatment:                                                DATE: 09/16/22 Therapeutic Exercise: NuStep level 6 x 5 minutes UE/LE Leg press 1 x 10 @ 80 lbs; 2 x 10 @  100 lbs  Lateral band walks green band at shins 4 x 25 ft  Forward band walks green band at shins 4 x 25 ft  Backward band walks green band at shins 4 x 25 ft  Deadlift with green band 2 x 10  Opposite touchdown to cone 10 cones 2 x 10 each              PATIENT EDUCATION:  Education details: N/A Person educated: N/A Education method: N/A Education comprehension: N/A      HOME EXERCISE PROGRAM: Access Code: [email or web address] URL: [email or web address] Date: 08/13/2022 Prepared by: Gwendolyn Grant   Exercises - Supine Quad Set  - 2 x daily - 7 x weekly - 2 sets - 10 reps - 5 sec  hold - Seated Long Arc Quad  - 2 x daily - 7 x weekly - 2 sets - 10 reps - 5 sec hold - Long Sitting Calf Stretch with Strap  - 2 x daily - 7 x weekly - 3 sets - 30 sec  hold -  Supine Heel Slide  - 1 x daily - 7 x weekly - 1 sets - 10 reps - 5 sec hold - Sit to Stand  - 2 x daily - 7 x weekly - 1 sets - 5 reps   ASSESSMENT:   CLINICAL IMPRESSION: Patient is making good functional progress 6 weeks s/p Rt THA. Began to work into more extension movement with good tolerance to these exercises, though fatigues quickly on the RLE. Introduced Public house manager today with patient having difficulty maintaining lumbopelvic stability in this position. She is able to complete full range SLR with good control and no signs of quad lag, but this causes increased anterior hip pain, so discontinued. Otherwise good tolerance to ther ex without an increase in hip pain reported.    OBJECTIVE IMPAIRMENTS Abnormal gait, decreased activity tolerance, decreased balance, decreased mobility, difficulty walking, decreased ROM, decreased strength, improper body mechanics, postural dysfunction, and pain.    ACTIVITY LIMITATIONS carrying, lifting, bending, sitting, standing, squatting, sleeping, stairs, transfers, bed mobility, and locomotion level   PARTICIPATION LIMITATIONS: meal prep, cleaning, laundry, driving, shopping, community activity, and yard work   PERSONAL FACTORS Age and Fitness are also affecting patient's functional outcome.    REHAB POTENTIAL: Good   CLINICAL DECISION MAKING: Stable/uncomplicated   EVALUATION COMPLEXITY: Low     GOALS: Goals reviewed with patient? Yes   SHORT TERM GOALS: Target date:09/10/22 Patient will be independent and compliant with initial HEP.  Baseline: issued at eval  Goal status: met   2.  Patient will demonstrate at least 4-/5 Rt hip flexor strength to improve ease of getting in and out of her bed.  Baseline: see above  Goal status: met   3.  Patient will demonstrate at least 90 degrees of Rt hip flexion AROM to improve ability to don/doff shoes and socks. Baseline: see above Goal status: met     LONG TERM GOALS: Target date:  10/08/22   Patient will complete TUG in </= 12 seconds to reduce her fall risk.  Baseline: see above  Goal status: met   2.  Patient will complete 5 x STS in </= 12 seconds to improve ease of transfers Baseline: see above  Goal status: met   3.  Patient will ambulate household and community distance with LRAD Baseline: currently using RW; 08/26/22: [place] currently  Goal status: ongoing   4.  Patient will score at least 60/80 on the LEFS to signify  clinically meaningful improvement in functional abilities.  Baseline: 13/80; 48/80 08/26/22 Goal status: revised   5. Patient will demonstrate at least 4+/5 gross Rt hip strength to improve stability with walking and standing activity.  Baseline: see above Goal status: initial   6. Patient will report pain as </=3/10 to reduce her current functional limitations. Baseline: 5/10 at worst Goal status: initial       PLAN: PT FREQUENCY: 2x/week   PT DURATION: 8 weeks   PLANNED INTERVENTIONS: Therapeutic exercises, Therapeutic activity, Neuromuscular re-education, Balance training, Gait training, Patient/Family education, Self Care, Stair training, DME instructions, Dry Needling, Electrical stimulation, Cryotherapy, Moist heat, Taping, Manual therapy, and Re-evaluation   PLAN FOR NEXT SESSION: review HEP, manual to hip, progress strengthening as tolerated   Gwendolyn Grant, PT, DPT, ATC 09/23/22 12:27 PM

## 2022-09-25 ENCOUNTER — Ambulatory Visit: Payer: Medicaid Other

## 2022-09-25 DIAGNOSIS — M25551 Pain in right hip: Secondary | ICD-10-CM | POA: Diagnosis not present

## 2022-09-25 DIAGNOSIS — M25651 Stiffness of right hip, not elsewhere classified: Secondary | ICD-10-CM

## 2022-09-25 DIAGNOSIS — R2689 Other abnormalities of gait and mobility: Secondary | ICD-10-CM

## 2022-09-25 DIAGNOSIS — M6281 Muscle weakness (generalized): Secondary | ICD-10-CM

## 2022-09-25 NOTE — Therapy (Signed)
OUTPATIENT PHYSICAL THERAPY TREATMENT NOTE   Patient Name: Grace Carson MRN: [id number] DOB:[birth year], 46 y.o., female 52 Date: 09/25/2022  PCP: Trey Sailors, [place] REFERRING PROVIDER: Renette Butters, MD   END OF SESSION:   PT End of Session - 09/25/22 1014     Visit Number 13    Number of Visits 17    Date for PT Re-Evaluation 10/10/22    Authorization Type MCD    Authorization Time Period 09/03/22-10/14/22    Authorization - Visit Number 7    Authorization - Number of Visits 12    PT Start Time 8184    PT Stop Time 1055    PT Time Calculation (min) 41 min    Activity Tolerance Patient tolerated treatment well    Behavior During Therapy WFL for tasks assessed/performed                     Past Medical History:  Diagnosis Date   Arthritis    Bipolar 1 disorder ([place])    Depression    Post traumatic stress disorder (PTSD)    Social anxiety disorder    Past Surgical History:  Procedure Laterality Date   CESAREAN SECTION     TONSILLECTOMY     TOOTH EXTRACTION     TOTAL HIP ARTHROPLASTY Right 08/11/2022   Procedure: TOTAL HIP ARTHROPLASTY ANTERIOR APPROACH;  Surgeon: Renette Butters, MD;  Location: [facility];  Service: Orthopedics;  Laterality: Right;   Patient Active Problem List   Diagnosis Date Noted   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 No pain or soreness.   PAIN:  Are you having pain?None currently; Yes: NPRS scale:  at worst 3/10 Pain location: Rt hip Pain description:  dull;ache Aggravating factors: walking Relieving factors: rest   OBJECTIVE: (objective measures completed at initial evaluation unless otherwise dated)  DIAGNOSTIC FINDINGS:  IMPRESSION: Intraoperative fluoroscopic images of the right hip demonstrate total arthroplasty. No obvious perihardware fracture or component malpositioning.   PATIENT SURVEYS:  LEFS 13/80 08/26/22: 48/80   COGNITION:           Overall cognitive status: Within functional limits for tasks assessed                          SENSATION: Not tested   EDEMA:  N/A     POSTURE: rounded shoulders, forward head, and flexed trunk    PALPATION: Not assessed    LOWER EXTREMITY ROM:   Active ROM Right eval Left eval 08/26/22 Right 09/09/22 Right   Hip flexion 80   92 102  Hip extension        Hip abduction        Hip adduction        Hip internal rotation        Hip external rotation        Knee flexion        Knee extension        Ankle dorsiflexion        Ankle plantarflexion  Ankle inversion        Ankle eversion         (Blank rows = not tested)   LOWER EXTREMITY MMT:   MMT Right eval Left eval 08/26/22 09/04/22 09/16/22 09/25/22 Right   Hip flexion 3- 4+ Rt: 4-/5 Rt: 4+/5   5/5  Hip extension     Rt: 3-/5     Hip abduction     Rt: 3+/5   Rt: 4/5    Hip adduction          Hip internal rotation          Hip external rotation          Knee flexion 4+ 5 Rt: 5/5     Knee extension 4 5 Rt: 5/5     Ankle dorsiflexion          Ankle plantarflexion          Ankle inversion          Ankle eversion           (Blank rows = not tested)   Majority of Rt hip MMT deferred secondary to post-op acuity      FUNCTIONAL TESTS:  5 times sit to stand: 16.3 seconds; 08/26/22: 10 seconds  Timed up and go (TUG): 22 seconds RW; 08/21/22: 17.8 SPC; 08/26/22: 9.2 SPC   GAIT: Distance walked: 10 ft Assistive device utilized: Environmental consultant - 2 wheeled Level of assistance: Modified independence Comments: forward  flexed posture, decreased hip extension RLE, decreased push-off   08/26/22: Mod I with SPC; decreased hip extension, decreased push-off, forward flexed posture        TODAY'S TREATMENT: [facility] Adult PT Treatment:                                                DATE: 09/25/22 Therapeutic Exercise: NuStep level 6 x 5 minutes UE/LE  Leg press 1 x 10 @ 80 lbs; 1 x 10 @ 100 lbs; 1 x 10 @ 120 lbs  Cybex hip flexion 2 x 10 @ 25 lbs  Cybex hip abduction 2 x 10 @ 25 lbs  Cybex hip extension 2 x 10 @ 25 lbs  Bridge on stability ball 2 x 15  Updated HEP     [facility] Adult PT Treatment:                                                DATE: 09/23/22 Therapeutic Exercise: NuStep level 6 x 5 minutes UE/LE Prone hip extension 2 x 10  Backwards walking 4 x 20 ft  Squat to table 2 x 10 ; 15 lbs  Quadruped leg extension 2 x 10  Fire hydrant 2 x 10  Standing march to triple extension 1 x 10 each  SLR x 2 (d/c due to pain)    [facility] Adult PT Treatment:                                                DATE: 09/18/22 Therapeutic Exercise: NuStep level 6 x 5 minutes UE/LE  Prone quad stretch x  60 sec each  Hooklying butterfly stretch x 60 sec  Hamstring stretch with strap 2 x 30 sec  Hip bridge with abduction black band 2 x 15  Sidelying hip abduction RLE 2 x 10  Updated HEP   NMR: SLS 3 x 30 sec each  Forward Step ups on bosu 1 x 10 each  Lateral step ups on bosu 1 x 10 each               PATIENT EDUCATION:  Education details: HEP Person educated: patient;partner Education method: demo, handout Education comprehension: verbalized understanding      HOME EXERCISE PROGRAM: Access Code: [email or web address] URL: [email or web address] Date: 08/13/2022 Prepared by: Gwendolyn Grant   Exercises - Supine Quad Set  - 2 x daily - 7 x weekly - 2 sets - 10 reps - 5 sec  hold - Seated Long Arc Quad  - 2 x daily - 7 x weekly - 2 sets - 10 reps - 5 sec hold - Long Sitting Calf Stretch with Strap  - 2  x daily - 7 x weekly - 3 sets - 30 sec  hold - Supine Heel Slide  - 1 x daily - 7 x weekly - 1 sets - 10 reps - 5 sec hold - Sit to Stand  - 2 x daily - 7 x weekly - 1 sets - 5 reps   ASSESSMENT:   CLINICAL IMPRESSION: Patient tolerated session well today without reports of hip pain. Able to introduce machine strengthening for the hip with moderate loads with patient demonstrating good control and form. HEP updated to include further hip extensor strengthening.   OBJECTIVE IMPAIRMENTS Abnormal gait, decreased activity tolerance, decreased balance, decreased mobility, difficulty walking, decreased ROM, decreased strength, improper body mechanics, postural dysfunction, and pain.    ACTIVITY LIMITATIONS carrying, lifting, bending, sitting, standing, squatting, sleeping, stairs, transfers, bed mobility, and locomotion level   PARTICIPATION LIMITATIONS: meal prep, cleaning, laundry, driving, shopping, community activity, and yard work   PERSONAL FACTORS Age and Fitness are also affecting patient's functional outcome.    REHAB POTENTIAL: Good   CLINICAL DECISION MAKING: Stable/uncomplicated   EVALUATION COMPLEXITY: Low     GOALS: Goals reviewed with patient? Yes   SHORT TERM GOALS: Target date:09/10/22 Patient will be independent and compliant with initial HEP.  Baseline: issued at eval  Goal status: met   2.  Patient will demonstrate at least 4-/5 Rt hip flexor strength to improve ease of getting in and out of her bed.  Baseline: see above  Goal status: met   3.  Patient will demonstrate at least 90 degrees of Rt hip flexion AROM to improve ability to don/doff shoes and socks. Baseline: see above Goal status: met     LONG TERM GOALS: Target date: 10/08/22   Patient will complete TUG in </= 12 seconds to reduce her fall risk.  Baseline: see above  Goal status: met   2.  Patient will complete 5 x STS in </= 12 seconds to improve ease of transfers Baseline: see above  Goal  status: met   3.  Patient will ambulate household and community distance with LRAD Baseline: currently using RW; 08/26/22: [place] currently  Goal status: ongoing   4.  Patient will score at least 60/80 on the LEFS to signify clinically meaningful improvement in functional abilities.  Baseline: 13/80; 48/80 08/26/22 Goal status: revised   5. Patient will demonstrate at least 4+/5 gross Rt hip strength to improve stability with walking and  standing activity.  Baseline: see above Goal status: initial   6. Patient will report pain as </=3/10 to reduce her current functional limitations. Baseline: 5/10 at worst Goal status: initial       PLAN: PT FREQUENCY: 2x/week   PT DURATION: 8 weeks   PLANNED INTERVENTIONS: Therapeutic exercises, Therapeutic activity, Neuromuscular re-education, Balance training, Gait training, Patient/Family education, Self Care, Stair training, DME instructions, Dry Needling, Electrical stimulation, Cryotherapy, Moist heat, Taping, Manual therapy, and Re-evaluation   PLAN FOR NEXT SESSION: review HEP, manual to hip, progress strengthening as tolerated   Gwendolyn Grant, PT, DPT, ATC 09/25/22 10:56 AM

## 2022-09-29 NOTE — Therapy (Signed)
OUTPATIENT PHYSICAL THERAPY TREATMENT NOTE   Patient Name: Grace Carson MRN: [id number] DOB:1976/10/09, 46 y.o., female 49 Date: 09/30/2022  PCP: Trey Sailors, [place] REFERRING PROVIDER: Renette Butters, MD   END OF SESSION:   PT End of Session - 09/30/22 1012     Visit Number 14    Number of Visits 17    Date for PT Re-Evaluation 10/10/22    Authorization Type MCD    Authorization Time Period 09/03/22-10/14/22    Authorization - Visit Number 8    Authorization - Number of Visits 12    PT Start Time 5329    PT Stop Time 1056    PT Time Calculation (min) 44 min    Activity Tolerance Patient tolerated treatment well    Behavior During Therapy WFL for tasks assessed/performed                      Past Medical History:  Diagnosis Date   Arthritis    Bipolar 1 disorder ([place])    Depression    Post traumatic stress disorder (PTSD)    Social anxiety disorder    Past Surgical History:  Procedure Laterality Date   CESAREAN SECTION     TONSILLECTOMY     TOOTH EXTRACTION     TOTAL HIP ARTHROPLASTY Right 08/11/2022   Procedure: TOTAL HIP ARTHROPLASTY ANTERIOR APPROACH;  Surgeon: Renette Butters, MD;  Location: [facility];  Service: Orthopedics;  Laterality: Right;   Patient Active Problem List   Diagnosis Date Noted   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   PAIN:  Are you having pain?None currently; Yes: NPRS scale:  at worst 2/10 Pain location: Rt hip Pain description: dull;ache Aggravating factors:  walking Relieving factors: rest   OBJECTIVE: (objective measures completed at initial evaluation unless otherwise dated)  DIAGNOSTIC FINDINGS:  IMPRESSION: Intraoperative fluoroscopic images of the right hip demonstrate total arthroplasty. No obvious perihardware fracture or component malpositioning.   PATIENT SURVEYS:  LEFS 13/80 08/26/22: 48/80   COGNITION:           Overall cognitive status: Within functional limits for tasks assessed                          SENSATION: Not tested   EDEMA:  N/A     POSTURE: rounded shoulders, forward head, and flexed trunk    PALPATION: Not assessed    LOWER EXTREMITY ROM:   Active ROM Right eval Left eval 08/26/22 Right 09/09/22 Right   Hip flexion 80   92 102  Hip extension        Hip abduction        Hip adduction        Hip internal rotation        Hip external rotation        Knee flexion        Knee extension        Ankle dorsiflexion        Ankle plantarflexion  Ankle inversion        Ankle eversion         (Blank rows = not tested)   LOWER EXTREMITY MMT:   MMT Right eval Left eval 08/26/22 09/04/22 09/16/22 09/25/22 Right   Hip flexion 3- 4+ Rt: 4-/5 Rt: 4+/5   5/5  Hip extension     Rt: 3-/5     Hip abduction     Rt: 3+/5   Rt: 4/5    Hip adduction          Hip internal rotation          Hip external rotation          Knee flexion 4+ 5 Rt: 5/5     Knee extension 4 5 Rt: 5/5     Ankle dorsiflexion          Ankle plantarflexion          Ankle inversion          Ankle eversion           (Blank rows = not tested)   Majority of Rt hip MMT deferred secondary to post-op acuity      FUNCTIONAL TESTS:  5 times sit to stand: 16.3 seconds; 08/26/22: 10 seconds  Timed up and go (TUG): 22 seconds RW; 08/21/22: 17.8 SPC; 08/26/22: 9.2 SPC   GAIT: Distance walked: 10 ft Assistive device utilized: Environmental consultant - 2 wheeled Level of assistance: Modified independence Comments: forward flexed posture, decreased hip  extension RLE, decreased push-off   08/26/22: Mod I with SPC; decreased hip extension, decreased push-off, forward flexed posture        TODAY'S TREATMENT: [facility] Adult PT Treatment:                                                DATE: 09/30/22 Therapeutic Exercise: Elliptical level 2 x 5 minutes  Kettle bell swing 2 x 10; 10 lbs  Forward lunge 1 x 10 each  Clamshells 2 x 15; black band Hip bridge with SL knee extension 2 x 10  Sidelying leg taps 1 x 10 bilateral    [facility] Adult PT Treatment:                                                DATE: 09/25/22 Therapeutic Exercise: NuStep level 6 x 5 minutes UE/LE  Leg press 1 x 10 @ 80 lbs; 1 x 10 @ 100 lbs; 1 x 10 @ 120 lbs  Cybex hip flexion 2 x 10 @ 25 lbs  Cybex hip abduction 2 x 10 @ 25 lbs  Cybex hip extension 2 x 10 @ 25 lbs  Bridge on stability ball 2 x 15  Updated HEP     [facility] Adult PT Treatment:                                                DATE: 09/23/22 Therapeutic Exercise: NuStep level 6 x 5 minutes UE/LE Prone hip extension 2 x 10  Backwards walking 4 x 20 ft  Squat to table 2 x 10 ;  15 lbs  Quadruped leg extension 2 x 10  Fire hydrant 2 x 10  Standing march to triple extension 1 x 10 each  SLR x 2 (d/c due to pain)               PATIENT EDUCATION:  Education details: n/a Person educated: n/a Education method: n/a Education comprehension: n/a   HOME EXERCISE PROGRAM: Access Code: [email or web address] URL: [email or web address] Date: 08/13/2022 Prepared by: Gwendolyn Grant   Exercises - Supine Quad Set  - 2 x daily - 7 x weekly - 2 sets - 10 reps - 5 sec  hold - Seated Long Arc Quad  - 2 x daily - 7 x weekly - 2 sets - 10 reps - 5 sec hold - Long Sitting Calf Stretch with Strap  - 2 x daily - 7 x weekly - 3 sets - 30 sec  hold - Supine Heel Slide  - 1 x daily - 7 x weekly - 1 sets - 10 reps - 5 sec hold - Sit to Stand  - 2 x daily - 7 x weekly - 1 sets - 5 reps   ASSESSMENT:   CLINICAL  IMPRESSION: Patient tolerated session well today without reports of hip pain. Started with progression of closed chain strengthening introducing lunging and further squatting activity. She was able to complete elliptical today without reports of hip pain, but quickly fatigues. With lunge activity she reported pulling sensation in her glutes with stepping forward on the RLE. After first set of lunges she reported feeling light-headed and admitted to not eating this morning. Patient had water and a peppermint, but requested to finish strengthening on the table vs. standing to keep from getting light-headed again. Good tolerance to mat table strengthening without hip pain or reports of light-headedness.   OBJECTIVE IMPAIRMENTS Abnormal gait, decreased activity tolerance, decreased balance, decreased mobility, difficulty walking, decreased ROM, decreased strength, improper body mechanics, postural dysfunction, and pain.    ACTIVITY LIMITATIONS carrying, lifting, bending, sitting, standing, squatting, sleeping, stairs, transfers, bed mobility, and locomotion level   PARTICIPATION LIMITATIONS: meal prep, cleaning, laundry, driving, shopping, community activity, and yard work   PERSONAL FACTORS Age and Fitness are also affecting patient's functional outcome.    REHAB POTENTIAL: Good   CLINICAL DECISION MAKING: Stable/uncomplicated   EVALUATION COMPLEXITY: Low     GOALS: Goals reviewed with patient? Yes   SHORT TERM GOALS: Target date:09/10/22 Patient will be independent and compliant with initial HEP.  Baseline: issued at eval  Goal status: met   2.  Patient will demonstrate at least 4-/5 Rt hip flexor strength to improve ease of getting in and out of her bed.  Baseline: see above  Goal status: met   3.  Patient will demonstrate at least 90 degrees of Rt hip flexion AROM to improve ability to don/doff shoes and socks. Baseline: see above Goal status: met     LONG TERM GOALS: Target date:  10/08/22   Patient will complete TUG in </= 12 seconds to reduce her fall risk.  Baseline: see above  Goal status: met   2.  Patient will complete 5 x STS in </= 12 seconds to improve ease of transfers Baseline: see above  Goal status: met   3.  Patient will ambulate household and community distance with LRAD Baseline: currently using RW; 08/26/22: [place] currently  Goal status: ongoing   4.  Patient will score at least 60/80 on the LEFS to signify clinically meaningful improvement in functional  abilities.  Baseline: 13/80; 48/80 08/26/22 Goal status: revised   5. Patient will demonstrate at least 4+/5 gross Rt hip strength to improve stability with walking and standing activity.  Baseline: see above Goal status: initial   6. Patient will report pain as </=3/10 to reduce her current functional limitations. Baseline: 5/10 at worst Goal status: initial       PLAN: PT FREQUENCY: 2x/week   PT DURATION: 8 weeks   PLANNED INTERVENTIONS: Therapeutic exercises, Therapeutic activity, Neuromuscular re-education, Balance training, Gait training, Patient/Family education, Self Care, Stair training, DME instructions, Dry Needling, Electrical stimulation, Cryotherapy, Moist heat, Taping, Manual therapy, and Re-evaluation   PLAN FOR NEXT SESSION: review HEP, manual to hip, progress strengthening as tolerated   Gwendolyn Grant, PT, DPT, ATC 09/30/22 10:56 AM

## 2022-09-30 ENCOUNTER — Ambulatory Visit: Payer: Medicaid Other | Attending: Orthopedic Surgery

## 2022-09-30 DIAGNOSIS — M25551 Pain in right hip: Secondary | ICD-10-CM | POA: Diagnosis present

## 2022-09-30 DIAGNOSIS — M25651 Stiffness of right hip, not elsewhere classified: Secondary | ICD-10-CM | POA: Diagnosis present

## 2022-09-30 DIAGNOSIS — M6281 Muscle weakness (generalized): Secondary | ICD-10-CM | POA: Diagnosis present

## 2022-09-30 DIAGNOSIS — R2689 Other abnormalities of gait and mobility: Secondary | ICD-10-CM | POA: Insufficient documentation

## 2022-10-01 NOTE — Therapy (Signed)
OUTPATIENT PHYSICAL THERAPY TREATMENT NOTE   Patient Name: Grace Carson MRN: [id number] DOB:1976-12-01, 46 y.o., female 47 Date: 10/02/2022  PCP: Trey Sailors, [place] REFERRING PROVIDER: Renette Butters, MD   END OF SESSION:   PT End of Session - 10/02/22 1014     Visit Number 15    Number of Visits 17    Date for PT Re-Evaluation 10/10/22    Authorization Type MCD    Authorization Time Period 09/03/22-10/14/22    Authorization - Visit Number 9    Authorization - Number of Visits 12    PT Start Time 4098    PT Stop Time 1055    PT Time Calculation (min) 41 min    Activity Tolerance Patient tolerated treatment well    Behavior During Therapy WFL for tasks assessed/performed                       Past Medical History:  Diagnosis Date   Arthritis    Bipolar 1 disorder ([place])    Depression    Post traumatic stress disorder (PTSD)    Social anxiety disorder    Past Surgical History:  Procedure Laterality Date   CESAREAN SECTION     TONSILLECTOMY     TOOTH EXTRACTION     TOTAL HIP ARTHROPLASTY Right 08/11/2022   Procedure: TOTAL HIP ARTHROPLASTY ANTERIOR APPROACH;  Surgeon: Renette Butters, MD;  Location: [facility];  Service: Orthopedics;  Laterality: Right;   Patient Active Problem List   Diagnosis Date Noted   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   PAIN:  Are you having pain?No  OBJECTIVE: (objective measures completed at initial evaluation unless otherwise dated)  DIAGNOSTIC  FINDINGS:  IMPRESSION: Intraoperative fluoroscopic images of the right hip demonstrate total arthroplasty. No obvious perihardware fracture or component malpositioning.   PATIENT SURVEYS:  LEFS 13/80 08/26/22: 48/80   COGNITION:           Overall cognitive status: Within functional limits for tasks assessed                          SENSATION: Not tested   EDEMA:  N/A     POSTURE: rounded shoulders, forward head, and flexed trunk    PALPATION: Not assessed    LOWER EXTREMITY ROM:   Active ROM Right eval Left eval 08/26/22 Right 09/09/22 Right   Hip flexion 80   92 102  Hip extension        Hip abduction        Hip adduction        Hip internal rotation        Hip external rotation        Knee flexion        Knee extension        Ankle dorsiflexion        Ankle plantarflexion        Ankle inversion        Ankle eversion         (  Blank rows = not tested)   LOWER EXTREMITY MMT:   MMT Right eval Left eval 08/26/22 09/04/22 09/16/22 09/25/22 Right   Hip flexion 3- 4+ Rt: 4-/5 Rt: 4+/5   5/5  Hip extension     Rt: 3-/5     Hip abduction     Rt: 3+/5   Rt: 4/5    Hip adduction          Hip internal rotation          Hip external rotation          Knee flexion 4+ 5 Rt: 5/5     Knee extension 4 5 Rt: 5/5     Ankle dorsiflexion          Ankle plantarflexion          Ankle inversion          Ankle eversion           (Blank rows = not tested)   Majority of Rt hip MMT deferred secondary to post-op acuity      FUNCTIONAL TESTS:  5 times sit to stand: 16.3 seconds; 08/26/22: 10 seconds  Timed up and go (TUG): 22 seconds RW; 08/21/22: 17.8 SPC; 08/26/22: 9.2 SPC   GAIT: Distance walked: 10 ft Assistive device utilized: Environmental consultant - 2 wheeled Level of assistance: Modified independence Comments: forward flexed posture, decreased hip extension RLE, decreased push-off   08/26/22: Mod I with SPC; decreased hip extension, decreased push-off, forward flexed posture         TODAY'S TREATMENT: [facility] Adult PT Treatment:                                                DATE: 10/02/22 Therapeutic Exercise: Elliptical level 2 x 5 minutes  HS stretch with strap x 30 sec each Prone quad stretch with strap x 60 sec each  Forward Step ups knee driver 2 x 10; 8 inch step Walking quad and hamstring stretch x 15 ft each   Lateral step ups 1 x 10; 8 inch step  Donkey kicks 2 x 10  Hip bridge on stability ball 2 x 10     [facility] Adult PT Treatment:                                                DATE: 09/30/22 Therapeutic Exercise: Elliptical level 2 x 5 minutes  Kettle bell swing 2 x 10; 10 lbs  Forward lunge 1 x 10 each  Clamshells 2 x 15; black band Hip bridge with SL knee extension 2 x 10  Sidelying leg taps 1 x 10 bilateral    [facility] Adult PT Treatment:                                                DATE: 09/25/22 Therapeutic Exercise: NuStep level 6 x 5 minutes UE/LE  Leg press 1 x 10 @ 80 lbs; 1 x 10 @ 100 lbs; 1 x 10 @ 120 lbs  Cybex hip flexion 2 x 10 @ 25 lbs  Cybex hip abduction 2 x  10 @ 25 lbs  Cybex hip extension 2 x 10 @ 25 lbs  Bridge on stability ball 2 x 15  Updated HEP     [facility] Adult PT Treatment:                                                DATE: 09/23/22 Therapeutic Exercise: NuStep level 6 x 5 minutes UE/LE Prone hip extension 2 x 10  Backwards walking 4 x 20 ft  Squat to table 2 x 10 ; 15 lbs  Quadruped leg extension 2 x 10  Fire hydrant 2 x 10  Standing march to triple extension 1 x 10 each  SLR x 2 (d/c due to pain)               PATIENT EDUCATION:  Education details: n/a Person educated: n/a Education method: n/a Education comprehension: n/a   HOME EXERCISE PROGRAM: Access Code: [email or web address] URL: [email or web address] Date: 08/13/2022 Prepared by: Gwendolyn Grant   Exercises - Supine Quad Set  - 2 x daily - 7 x weekly - 2 sets - 10 reps - 5 sec  hold - Seated Long Arc Quad  - 2 x daily - 7 x weekly - 2 sets  - 10 reps - 5 sec hold - Long Sitting Calf Stretch with Strap  - 2 x daily - 7 x weekly - 3 sets - 30 sec  hold - Supine Heel Slide  - 1 x daily - 7 x weekly - 1 sets - 10 reps - 5 sec hold - Sit to Stand  - 2 x daily - 7 x weekly - 1 sets - 5 reps   ASSESSMENT:   CLINICAL IMPRESSION: Patient tolerated session well today without reports of hip pain. Focused on progression of hip strengthening. Continues to have mild instability with stepping activity requiring occasional use of reaching strategy to maintain balance. She has difficulty maintaining lumbopelvic stability with quadruped strengthening. She reported a reduction in soreness at conclusion of session.   OBJECTIVE IMPAIRMENTS Abnormal gait, decreased activity tolerance, decreased balance, decreased mobility, difficulty walking, decreased ROM, decreased strength, improper body mechanics, postural dysfunction, and pain.    ACTIVITY LIMITATIONS carrying, lifting, bending, sitting, standing, squatting, sleeping, stairs, transfers, bed mobility, and locomotion level   PARTICIPATION LIMITATIONS: meal prep, cleaning, laundry, driving, shopping, community activity, and yard work   PERSONAL FACTORS Age and Fitness are also affecting patient's functional outcome.    REHAB POTENTIAL: Good   CLINICAL DECISION MAKING: Stable/uncomplicated   EVALUATION COMPLEXITY: Low     GOALS: Goals reviewed with patient? Yes   SHORT TERM GOALS: Target date:09/10/22 Patient will be independent and compliant with initial HEP.  Baseline: issued at eval  Goal status: met   2.  Patient will demonstrate at least 4-/5 Rt hip flexor strength to improve ease of getting in and out of her bed.  Baseline: see above  Goal status: met   3.  Patient will demonstrate at least 90 degrees of Rt hip flexion AROM to improve ability to don/doff shoes and socks. Baseline: see above Goal status: met     LONG TERM GOALS: Target date: 10/08/22   Patient will complete  TUG in </= 12 seconds to reduce her fall risk.  Baseline: see above  Goal status: met   2.  Patient will complete 5 x STS in </=  12 seconds to improve ease of transfers Baseline: see above  Goal status: met   3.  Patient will ambulate household and community distance with LRAD Baseline: currently using RW; 08/26/22: [address] currently  Goal status: ongoing   4.  Patient will score at least 60/80 on the LEFS to signify clinically meaningful improvement in functional abilities.  Baseline: 13/80; 48/80 08/26/22 Goal status: revised   5. Patient will demonstrate at least 4+/5 gross Rt hip strength to improve stability with walking and standing activity.  Baseline: see above Goal status: initial   6. Patient will report pain as </=3/10 to reduce her current functional limitations. Baseline: 5/10 at worst Goal status: initial       PLAN: PT FREQUENCY: 2x/week   PT DURATION: 8 weeks   PLANNED INTERVENTIONS: Therapeutic exercises, Therapeutic activity, Neuromuscular re-education, Balance training, Gait training, Patient/Family education, Self Care, Stair training, DME instructions, Dry Needling, Electrical stimulation, Cryotherapy, Moist heat, Taping, Manual therapy, and Re-evaluation   PLAN FOR NEXT SESSION: hip strengthening, progress closed chain   Gwendolyn Grant, PT, DPT, ATC 10/02/22 10:57 AM

## 2022-10-02 ENCOUNTER — Ambulatory Visit: Payer: Medicaid Other

## 2022-10-02 DIAGNOSIS — M6281 Muscle weakness (generalized): Secondary | ICD-10-CM

## 2022-10-02 DIAGNOSIS — M25651 Stiffness of right hip, not elsewhere classified: Secondary | ICD-10-CM

## 2022-10-02 DIAGNOSIS — M25551 Pain in right hip: Secondary | ICD-10-CM

## 2022-10-02 DIAGNOSIS — R2689 Other abnormalities of gait and mobility: Secondary | ICD-10-CM

## 2022-10-05 NOTE — Therapy (Signed)
OUTPATIENT PHYSICAL THERAPY TREATMENT NOTE   Patient Name: Grace Carson MRN: [id number] DOB:27-May-1976, 46 y.o., female Today's Date: 10/05/2022  PCP: Trey Sailors, [place] REFERRING PROVIDER: Renette Butters, MD   END OF SESSION:               Past Medical History:  Diagnosis Date   Arthritis    Bipolar 1 disorder [facility])    Depression    Post traumatic stress disorder (PTSD)    Social anxiety disorder    Past Surgical History:  Procedure Laterality Date   CESAREAN SECTION     TONSILLECTOMY     TOOTH EXTRACTION     TOTAL HIP ARTHROPLASTY Right 08/11/2022   Procedure: TOTAL HIP ARTHROPLASTY ANTERIOR APPROACH;  Surgeon: Renette Butters, MD;  Location: [facility];  Service: Orthopedics;  Laterality: Right;   Patient Active Problem List   Diagnosis Date Noted   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  Rationale for Evaluation and Treatment Rehabilitation  PERTINENT HISTORY:  Rt THA 08/11/22 Arthritis Bipolar PTSD Social anxiety disorder   PRECAUTIONS: Anterior hip  SUBJECTIVE: Patient reports her legs are sore from last session.   PAIN:  Are you having pain?No  OBJECTIVE: (objective measures completed at initial evaluation unless otherwise dated)  DIAGNOSTIC FINDINGS:  IMPRESSION: Intraoperative fluoroscopic images of the right hip demonstrate total arthroplasty. No obvious perihardware fracture or component malpositioning.   PATIENT SURVEYS:  LEFS 13/80 08/26/22: 48/80   COGNITION:           Overall cognitive status: Within functional limits for tasks assessed                          SENSATION: Not tested   EDEMA:  N/A     POSTURE: rounded shoulders, forward head, and flexed trunk    PALPATION: Not assessed    LOWER EXTREMITY ROM:   Active ROM Right eval Left eval 08/26/22 Right 09/09/22 Right   Hip flexion 80   92 102  Hip  extension        Hip abduction        Hip adduction        Hip internal rotation        Hip external rotation        Knee flexion        Knee extension        Ankle dorsiflexion        Ankle plantarflexion        Ankle inversion        Ankle eversion         (Blank rows = not tested)   LOWER EXTREMITY MMT:   MMT Right eval Left eval 08/26/22 09/04/22 09/16/22 09/25/22 Right   Hip flexion 3- 4+ Rt: 4-/5 Rt: 4+/5   5/5  Hip extension     Rt: 3-/5     Hip abduction     Rt: 3+/5   Rt: 4/5    Hip adduction          Hip internal rotation          Hip external rotation          Knee flexion 4+ 5 Rt: 5/5     Knee extension 4 5 Rt: 5/5     Ankle dorsiflexion  Ankle plantarflexion          Ankle inversion          Ankle eversion           (Blank rows = not tested)   Majority of Rt hip MMT deferred secondary to post-op acuity      FUNCTIONAL TESTS:  5 times sit to stand: 16.3 seconds; 08/26/22: 10 seconds  Timed up and go (TUG): 22 seconds RW; 08/21/22: 17.8 SPC; 08/26/22: 9.2 SPC   GAIT: Distance walked: 10 ft Assistive device utilized: Environmental consultant - 2 wheeled Level of assistance: Modified independence Comments: forward flexed posture, decreased hip extension RLE, decreased push-off   08/26/22: Mod I with SPC; decreased hip extension, decreased push-off, forward flexed posture        TODAY'S TREATMENT: [facility] Adult PT Treatment:                                                DATE: 10/02/22 Therapeutic Exercise: Elliptical level 2 x 5 minutes  HS stretch with strap x 30 sec each Prone quad stretch with strap x 60 sec each  Forward Step ups knee driver 2 x 10; 8 inch step Walking quad and hamstring stretch x 15 ft each   Lateral step ups 1 x 10; 8 inch step  Donkey kicks 2 x 10  Hip bridge on stability ball 2 x 10     [facility] Adult PT Treatment:                                                DATE: 09/30/22 Therapeutic Exercise: Elliptical level 2 x 5 minutes  Kettle bell  swing 2 x 10; 10 lbs  Forward lunge 1 x 10 each  Clamshells 2 x 15; black band Hip bridge with SL knee extension 2 x 10  Sidelying leg taps 1 x 10 bilateral    [facility] Adult PT Treatment:                                                DATE: 09/25/22 Therapeutic Exercise: NuStep level 6 x 5 minutes UE/LE  Leg press 1 x 10 @ 80 lbs; 1 x 10 @ 100 lbs; 1 x 10 @ 120 lbs  Cybex hip flexion 2 x 10 @ 25 lbs  Cybex hip abduction 2 x 10 @ 25 lbs  Cybex hip extension 2 x 10 @ 25 lbs  Bridge on stability ball 2 x 15  Updated HEP     [facility] Adult PT Treatment:                                                DATE: 09/23/22 Therapeutic Exercise: NuStep level 6 x 5 minutes UE/LE Prone hip extension 2 x 10  Backwards walking 4 x 20 ft  Squat to table 2 x 10 ; 15 lbs  Quadruped leg extension 2 x 10  Fire hydrant 2 x 10  Standing  march to triple extension 1 x 10 each  SLR x 2 (d/c due to pain)               PATIENT EDUCATION:  Education details: n/a Person educated: n/a Education method: n/a Education comprehension: n/a   HOME EXERCISE PROGRAM: Access Code: [email or web address] URL: [email or web address] Date: 08/13/2022 Prepared by: Gwendolyn Grant   Exercises - Supine Quad Set  - 2 x daily - 7 x weekly - 2 sets - 10 reps - 5 sec  hold - Seated Long Arc Quad  - 2 x daily - 7 x weekly - 2 sets - 10 reps - 5 sec hold - Long Sitting Calf Stretch with Strap  - 2 x daily - 7 x weekly - 3 sets - 30 sec  hold - Supine Heel Slide  - 1 x daily - 7 x weekly - 1 sets - 10 reps - 5 sec hold - Sit to Stand  - 2 x daily - 7 x weekly - 1 sets - 5 reps   ASSESSMENT:   CLINICAL IMPRESSION: Patient tolerated session well today without reports of hip pain. Focused on progression of hip strengthening. Continues to have mild instability with stepping activity requiring occasional use of reaching strategy to maintain balance. She has difficulty maintaining lumbopelvic stability with quadruped  strengthening. She reported a reduction in soreness at conclusion of session.   OBJECTIVE IMPAIRMENTS Abnormal gait, decreased activity tolerance, decreased balance, decreased mobility, difficulty walking, decreased ROM, decreased strength, improper body mechanics, postural dysfunction, and pain.    ACTIVITY LIMITATIONS carrying, lifting, bending, sitting, standing, squatting, sleeping, stairs, transfers, bed mobility, and locomotion level   PARTICIPATION LIMITATIONS: meal prep, cleaning, laundry, driving, shopping, community activity, and yard work   PERSONAL FACTORS Age and Fitness are also affecting patient's functional outcome.    REHAB POTENTIAL: Good   CLINICAL DECISION MAKING: Stable/uncomplicated   EVALUATION COMPLEXITY: Low     GOALS: Goals reviewed with patient? Yes   SHORT TERM GOALS: Target date:09/10/22 Patient will be independent and compliant with initial HEP.  Baseline: issued at eval  Goal status: met   2.  Patient will demonstrate at least 4-/5 Rt hip flexor strength to improve ease of getting in and out of her bed.  Baseline: see above  Goal status: met   3.  Patient will demonstrate at least 90 degrees of Rt hip flexion AROM to improve ability to don/doff shoes and socks. Baseline: see above Goal status: met     LONG TERM GOALS: Target date: 10/08/22   Patient will complete TUG in </= 12 seconds to reduce her fall risk.  Baseline: see above  Goal status: met   2.  Patient will complete 5 x STS in </= 12 seconds to improve ease of transfers Baseline: see above  Goal status: met   3.  Patient will ambulate household and community distance with LRAD Baseline: currently using RW; 08/26/22: [place] currently  Goal status: ongoing   4.  Patient will score at least 60/80 on the LEFS to signify clinically meaningful improvement in functional abilities.  Baseline: 13/80; 48/80 08/26/22 Goal status: revised   5. Patient will demonstrate at least 4+/5 gross Rt  hip strength to improve stability with walking and standing activity.  Baseline: see above Goal status: initial   6. Patient will report pain as </=3/10 to reduce her current functional limitations. Baseline: 5/10 at worst Goal status: initial       PLAN: PT FREQUENCY: 2x/week  PT DURATION: 8 weeks   PLANNED INTERVENTIONS: Therapeutic exercises, Therapeutic activity, Neuromuscular re-education, Balance training, Gait training, Patient/Family education, Self Care, Stair training, DME instructions, Dry Needling, Electrical stimulation, Cryotherapy, Moist heat, Taping, Manual therapy, and Re-evaluation   PLAN FOR NEXT SESSION: hip strengthening, progress closed chain   Gwendolyn Grant, PT, DPT, ATC 10/05/22 2:46 PM

## 2022-10-06 ENCOUNTER — Ambulatory Visit: Payer: Medicaid Other

## 2022-10-06 DIAGNOSIS — M25651 Stiffness of right hip, not elsewhere classified: Secondary | ICD-10-CM

## 2022-10-06 DIAGNOSIS — R2689 Other abnormalities of gait and mobility: Secondary | ICD-10-CM

## 2022-10-06 DIAGNOSIS — M25551 Pain in right hip: Secondary | ICD-10-CM

## 2022-10-06 DIAGNOSIS — M6281 Muscle weakness (generalized): Secondary | ICD-10-CM

## 2022-10-07 NOTE — Therapy (Signed)
OUTPATIENT PHYSICAL THERAPY TREATMENT NOTE PHYSICAL THERAPY DISCHARGE SUMMARY  Visits from Start of Care: 17  Current functional level related to goals / functional outcomes: All goals met   Remaining deficits: N/A   Education / Equipment: See education below    Patient agrees to discharge. Patient goals were met. Patient is being discharged due to meeting the stated rehab goals.   Patient Name: Grace Carson MRN: [id number] DOB:07-18-1976, 46 y.o., female 57 Date: 10/08/2022  PCP: Trey Sailors, [place] REFERRING PROVIDER: Renette Butters, MD   END OF SESSION:   PT End of Session - 10/08/22 1016     Visit Number 17    Number of Visits 17    Date for PT Re-Evaluation 10/10/22    Authorization Type MCD    Authorization Time Period 09/03/22-10/14/22    Authorization - Visit Number 11    Authorization - Number of Visits 12    PT Start Time 1016    PT Stop Time 1044    PT Time Calculation (min) 28 min    Activity Tolerance Patient tolerated treatment well    Behavior During Therapy WFL for tasks assessed/performed                         Past Medical History:  Diagnosis Date   Arthritis    Bipolar 1 disorder ([place])    Depression    Post traumatic stress disorder (PTSD)    Social anxiety disorder    Past Surgical History:  Procedure Laterality Date   CESAREAN SECTION     TONSILLECTOMY     TOOTH EXTRACTION     TOTAL HIP ARTHROPLASTY Right 08/11/2022   Procedure: TOTAL HIP ARTHROPLASTY ANTERIOR APPROACH;  Surgeon: Renette Butters, MD;  Location: [facility];  Service: Orthopedics;  Laterality: Right;   Patient Active Problem List   Diagnosis Date Noted   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   PAIN:  Are you having pain? No   OBJECTIVE: (objective measures completed at initial evaluation unless otherwise dated)  DIAGNOSTIC FINDINGS:  IMPRESSION: Intraoperative fluoroscopic images of the right hip demonstrate total arthroplasty. No obvious perihardware fracture or component malpositioning.   PATIENT SURVEYS:  LEFS 13/80 08/26/22: 48/80 10/08/22: 61/80   COGNITION:           Overall cognitive status: Within functional limits for tasks assessed                          SENSATION: Not tested   EDEMA:  N/A     POSTURE: rounded shoulders, forward head, and flexed trunk    PALPATION: Not assessed    LOWER EXTREMITY ROM:   Active ROM Right eval Left eval 08/26/22 Right 09/09/22 Right   Hip flexion 80   92 102  Hip extension        Hip abduction        Hip adduction        Hip internal  rotation        Hip external rotation        Knee flexion        Knee extension        Ankle dorsiflexion        Ankle plantarflexion        Ankle inversion        Ankle eversion         (Blank rows = not tested)   LOWER EXTREMITY MMT:   MMT Right eval Left eval 08/26/22 09/04/22 09/16/22 09/25/22 Right  10/08/22 Right   Hip flexion 3- 4+ Rt: 4-/5 Rt: 4+/5   5/5   Hip extension     Rt: 3-/5    5/5   Hip abduction     Rt: 3+/5   Rt: 4/5   5/5  Hip adduction           Hip internal rotation           Hip external rotation           Knee flexion 4+ 5 Rt: 5/5      Knee extension 4 5 Rt: 5/5      Ankle dorsiflexion           Ankle plantarflexion           Ankle inversion           Ankle eversion            (Blank rows = not tested)        FUNCTIONAL TESTS:  5 times sit to stand: 16.3 seconds; 08/26/22: 10 seconds  Timed up  and go (TUG): 22 seconds RW; 08/21/22: 17.8 SPC; 08/26/22: 9.2 SPC   GAIT: Distance walked: 10 ft Assistive device utilized: Environmental consultant - 2 wheeled Level of assistance: Modified independence Comments: forward flexed posture, decreased hip extension RLE, decreased push-off   08/26/22: Mod I with SPC; decreased hip extension, decreased push-off, forward flexed posture   10/07/22: Independent; WNL       TODAY'S TREATMENT: [facility] Adult PT Treatment:                                                DATE: 10/08/22 Therapeutic Exercise: Reviewed and updated HEP demonstrating and discussing frequency, duration, sets, reps and ways to progress.  Discussed gym equipment to utilize Discussed introducing sport activity to include independent shooting of the basketball and dribbling (no contact sport/pickup game currently)   Therapeutic Activity: Re-assessment to determine overall progress educating patient on progress towards goals.     [facility] Adult PT Treatment:                                                DATE: 10/06/22 Therapeutic Exercise: Elliptical level 2 x 5 minutes  SL leg press 2 x 10 each @ 40 lbs Kettlebell swings 2 x 10 @ 15 lbs 3 way hip on airex  2 x 10 each  HS curl 3 x 10 @ 25 lbs  Donkey kicks 2 x 10 each     [facility] Adult PT Treatment:  DATE: 10/02/22 Therapeutic Exercise: Elliptical level 2 x 5 minutes  HS stretch with strap x 30 sec each Prone quad stretch with strap x 60 sec each  Forward Step ups knee driver 2 x 10; 8 inch step Walking quad and hamstring stretch x 15 ft each   Lateral step ups 1 x 10; 8 inch step  Donkey kicks 2 x 10  Hip bridge on stability ball 2 x 10      PATIENT EDUCATION:  Education details: see treatment; d/c education  Person educated: patient and partner Education method: Herbalist, demo, handout Education comprehension: returned demo, verbalized understanding    HOME EXERCISE PROGRAM: Access  Code: [email or web address] URL: [email or web address] Date: 08/13/2022 Prepared by: Gwendolyn Grant   Exercises - Supine Quad Set  - 2 x daily - 7 x weekly - 2 sets - 10 reps - 5 sec  hold - Seated Long Arc Quad  - 2 x daily - 7 x weekly - 2 sets - 10 reps - 5 sec hold - Long Sitting Calf Stretch with Strap  - 2 x daily - 7 x weekly - 3 sets - 30 sec  hold - Supine Heel Slide  - 1 x daily - 7 x weekly - 1 sets - 10 reps - 5 sec hold - Sit to Stand  - 2 x daily - 7 x weekly - 1 sets - 5 reps   ASSESSMENT:   CLINICAL IMPRESSION: Patient has made excellent functional progress s/p Rt THA on 08/11/22. She has met all established functional goals. She has full strength and functional AROM about the Rt hip. She demonstrates independence with advanced home program and is appropriate for D/C at this time with patient in agreement with this plan.   OBJECTIVE IMPAIRMENTS Abnormal gait, decreased activity tolerance, decreased balance, decreased mobility, difficulty walking, decreased ROM, decreased strength, improper body mechanics, postural dysfunction, and pain.    ACTIVITY LIMITATIONS carrying, lifting, bending, sitting, standing, squatting, sleeping, stairs, transfers, bed mobility, and locomotion level   PARTICIPATION LIMITATIONS: meal prep, cleaning, laundry, driving, shopping, community activity, and yard work   PERSONAL FACTORS Age and Fitness are also affecting patient's functional outcome.    REHAB POTENTIAL: Good   CLINICAL DECISION MAKING: Stable/uncomplicated   EVALUATION COMPLEXITY: Low     GOALS: Goals reviewed with patient? Yes   SHORT TERM GOALS: Target date:09/10/22 Patient will be independent and compliant with initial HEP.  Baseline: issued at eval  Goal status: met   2.  Patient will demonstrate at least 4-/5 Rt hip flexor strength to improve ease of getting in and out of her bed.  Baseline: see above  Goal status: met   3.  Patient will demonstrate at least 90  degrees of Rt hip flexion AROM to improve ability to don/doff shoes and socks. Baseline: see above Goal status: met     LONG TERM GOALS: Target date: 10/08/22   Patient will complete TUG in </= 12 seconds to reduce her fall risk.  Baseline: see above  Goal status: met   2.  Patient will complete 5 x STS in </= 12 seconds to improve ease of transfers Baseline: see above  Goal status: met   3.  Patient will ambulate household and community distance with LRAD Baseline: currently using RW; 08/26/22: [place] currently  Goal status: met   4.  Patient will score at least 60/80 on the LEFS to signify clinically meaningful improvement in functional abilities.  Baseline: 13/80; 48/80 08/26/22 Goal status: met  5. Patient will demonstrate at least 4+/5 gross Rt hip strength to improve stability with walking and standing activity.  Baseline: see above Goal status: met   6. Patient will report pain as </=3/10 to reduce her current functional limitations. Baseline: 5/10 at worst Status: 2/10 at worst 10/08/22 Goal status: met     PLAN: PT FREQUENCY: N/A   PT DURATION: N/A   PLANNED INTERVENTIONS: Therapeutic exercises, Therapeutic activity, Neuromuscular re-education, Balance training, Gait training, Patient/Family education, Self Care, Stair training, DME instructions, Dry Needling, Electrical stimulation, Cryotherapy, Moist heat, Taping, Manual therapy, and Re-evaluation   PLAN FOR NEXT SESSION: N/A  Gwendolyn Grant, PT, DPT, ATC 10/08/22 10:52 AM

## 2022-10-08 ENCOUNTER — Ambulatory Visit: Payer: Medicaid Other

## 2022-10-08 DIAGNOSIS — M25551 Pain in right hip: Secondary | ICD-10-CM | POA: Diagnosis not present

## 2022-10-08 DIAGNOSIS — M25651 Stiffness of right hip, not elsewhere classified: Secondary | ICD-10-CM

## 2022-10-08 DIAGNOSIS — M6281 Muscle weakness (generalized): Secondary | ICD-10-CM

## 2022-10-08 DIAGNOSIS — R2689 Other abnormalities of gait and mobility: Secondary | ICD-10-CM

## 2023-01-24 IMAGING — CT CT HEAD W/O CM
3 series · 14 of 47 positions shown, 16 images · non-contrast
Comparison: Remote head CT 04/28/2010

CLINICAL DATA: Headache, sudden, severe



[Series 2: head wo · axial · 0.49mm/px · z∈[-120,+5]mm · 8 of 31 slices shown, 10 images]
[im 3/31  brain]
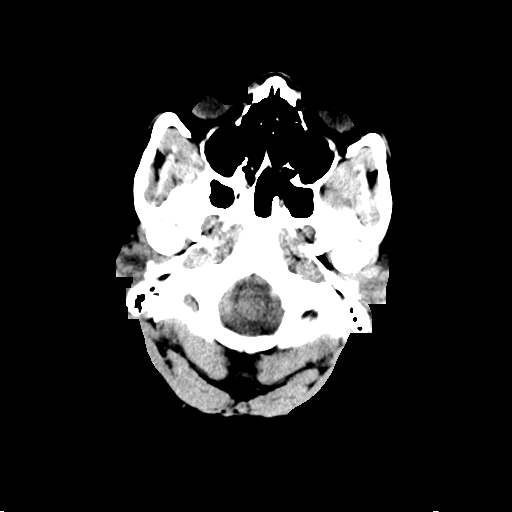
[im 3/31  bone]
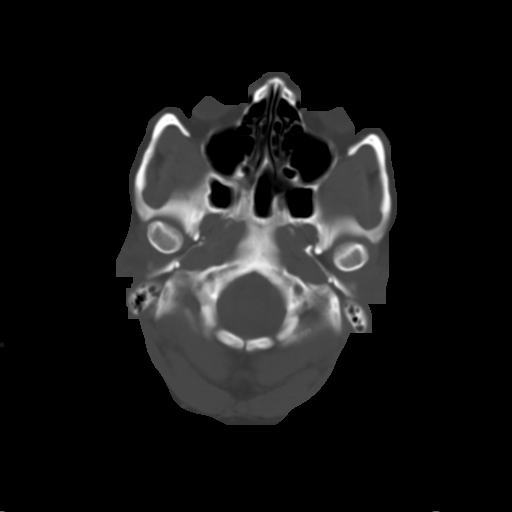
[im 7/31  brain]
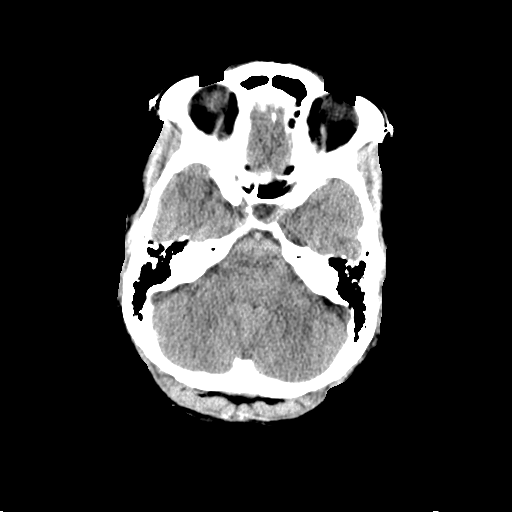
[im 10/31  brain]
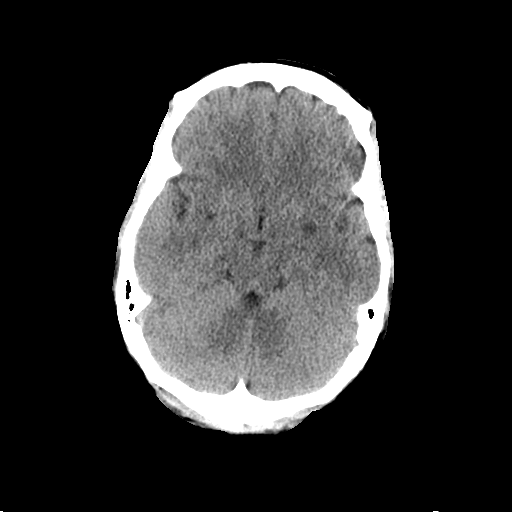
[im 14/31  brain]
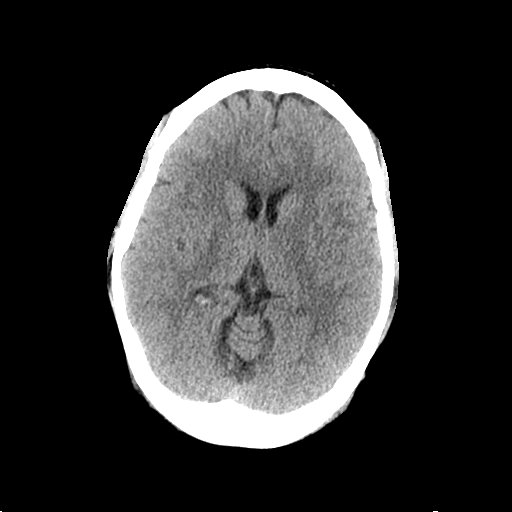
[im 17/31  brain]
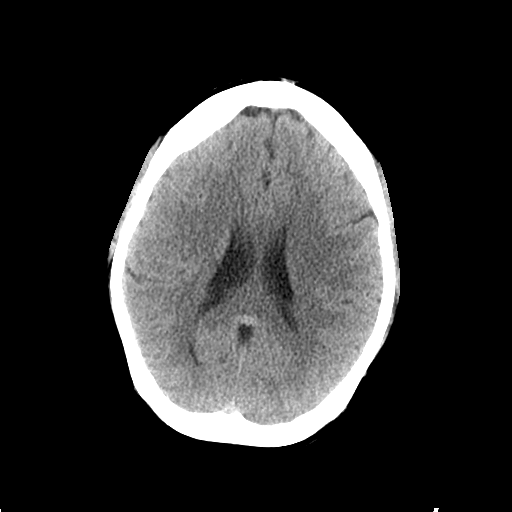
[im 17/31  bone]
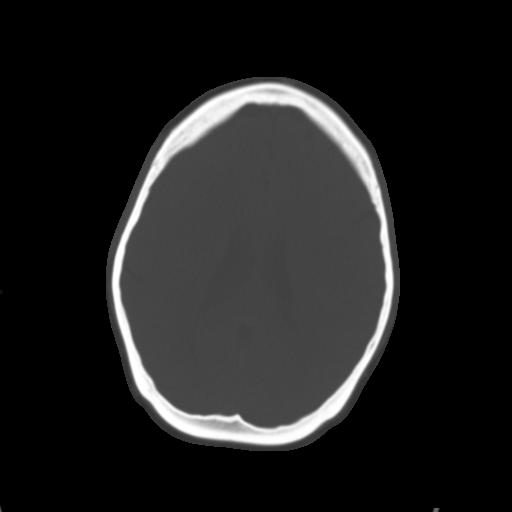
[im 21/31  brain]
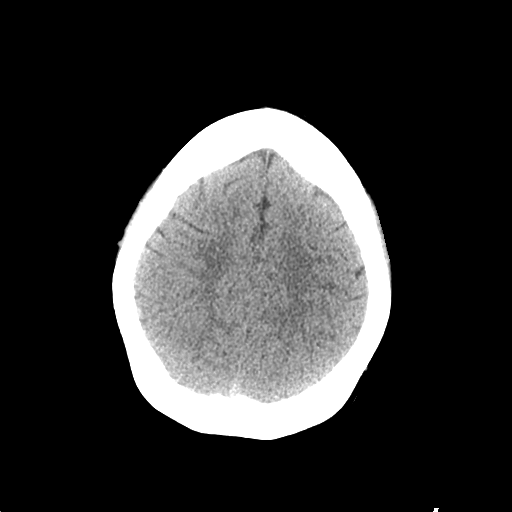
[im 24/31  brain]
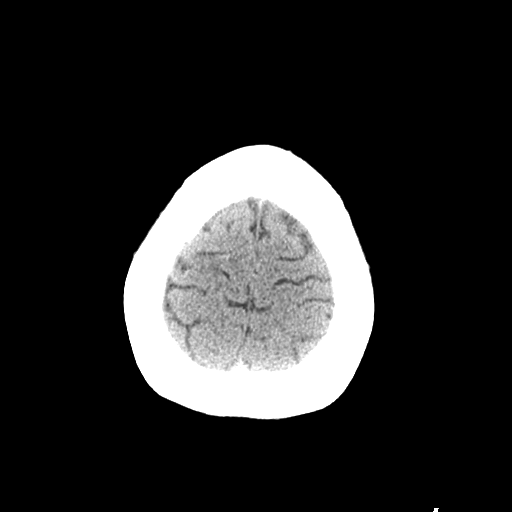
[im 28/31  brain]
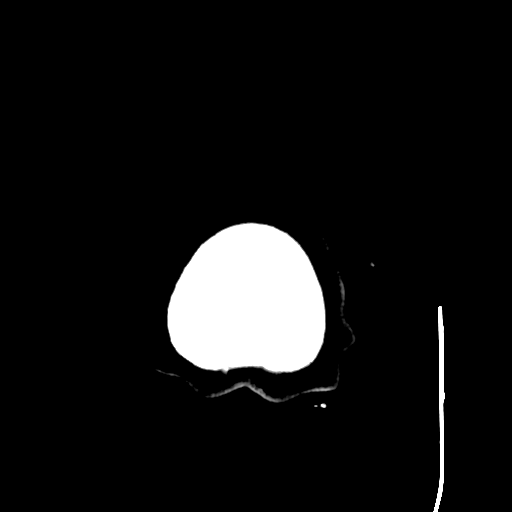

[Series 4: coronal soft tissue · coronal · 0.31mm/px · 3 of 67 slices shown]
[im 23/67  brain]
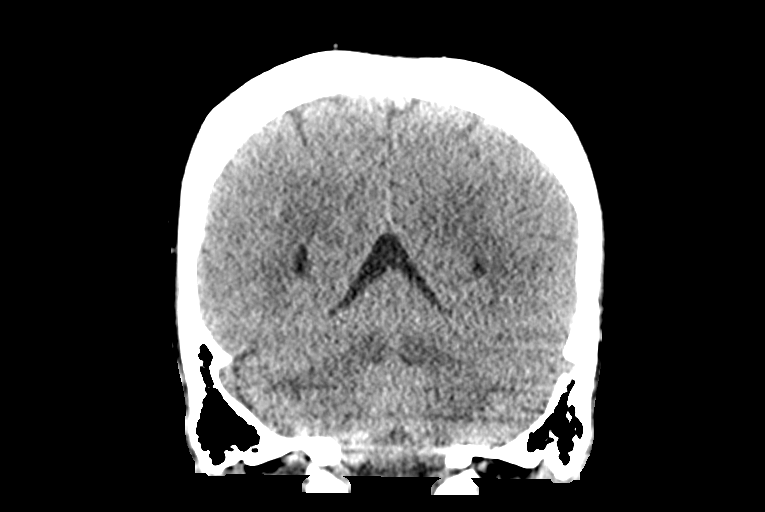
[im 30/67  brain]
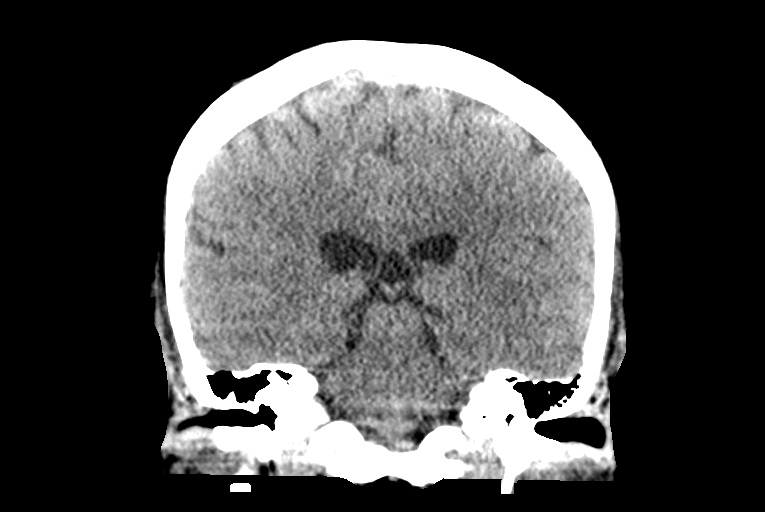
[im 37/67  brain]
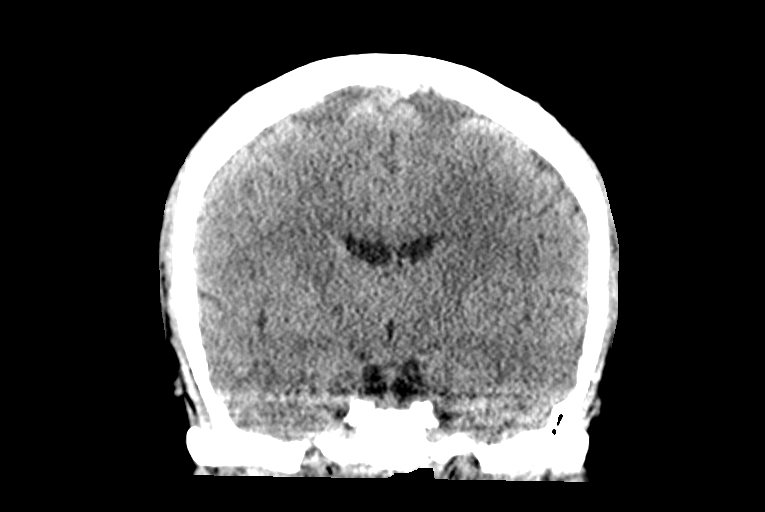

[Series 5: sagittal soft tissue · sagittal · 0.34mm/px · 3 of 54 slices shown]
[im 18/54  brain]
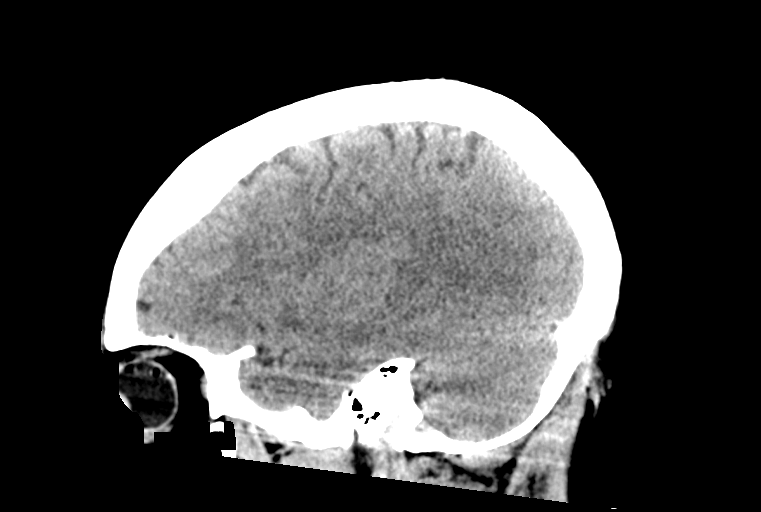
[im 27/54  brain]
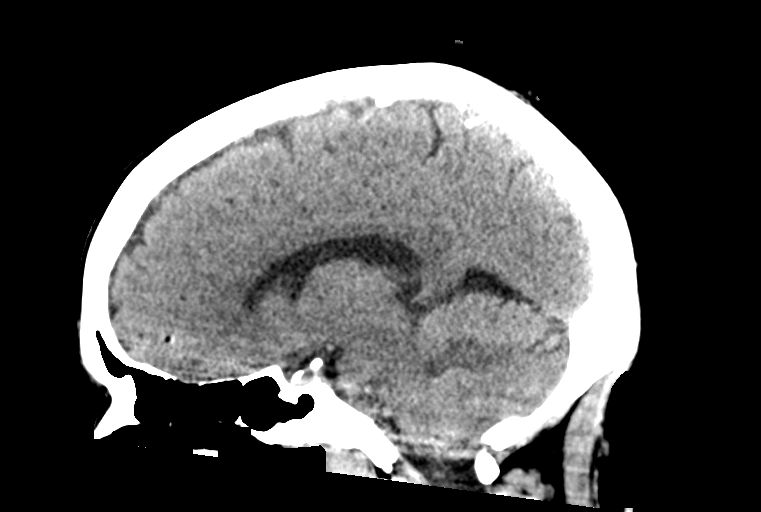
[im 36/54  brain]
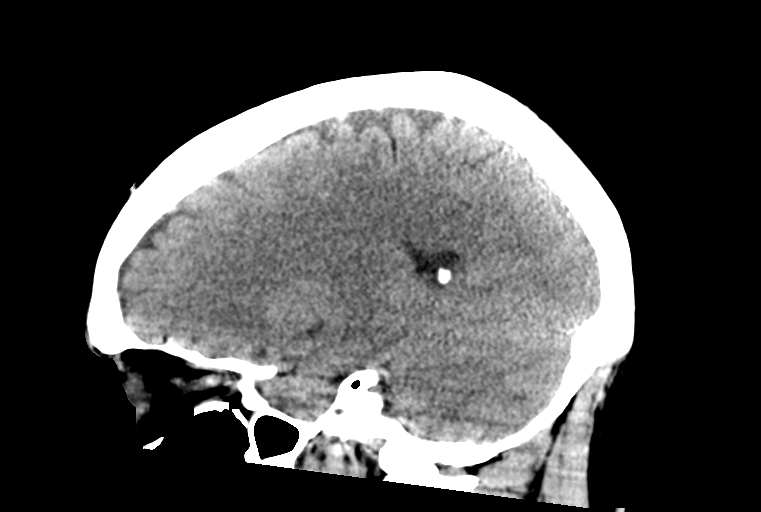

[14 of 47 positions shown; findings below may reference images not displayed]

FINDINGS: Brain: No intracranial hemorrhage, mass effect, or midline shift. No
hydrocephalus. The basilar cisterns are patent. No evidence of
territorial infarct or acute ischemia. No extra-axial or
intracranial fluid collection.

Vascular: No hyperdense vessel or unexpected calcification.

Skull: Normal. Negative for fracture or focal lesion.

Sinuses/Orbits: Mucous retention cyst in the left maxillary sinus,
chronic. No acute findings.

Other: None.
IMPRESSION: No acute intracranial abnormality.

## 2023-08-13 ENCOUNTER — Other Ambulatory Visit (HOSPITAL_BASED_OUTPATIENT_CLINIC_OR_DEPARTMENT_OTHER): Payer: Self-pay

## 2023-08-13 DIAGNOSIS — R0683 Snoring: Secondary | ICD-10-CM

## 2023-08-13 DIAGNOSIS — G478 Other sleep disorders: Secondary | ICD-10-CM

## 2023-09-30 ENCOUNTER — Other Ambulatory Visit: Payer: Self-pay | Admitting: Physician Assistant

## 2023-09-30 DIAGNOSIS — Z0000 Encounter for general adult medical examination without abnormal findings: Secondary | ICD-10-CM

## 2023-10-27 ENCOUNTER — Ambulatory Visit (HOSPITAL_BASED_OUTPATIENT_CLINIC_OR_DEPARTMENT_OTHER): Payer: MEDICAID | Attending: Physician Assistant | Admitting: Internal Medicine

## 2023-10-27 VITALS — Ht 65.0 in | Wt 250.0 lb

## 2023-10-27 DIAGNOSIS — G478 Other sleep disorders: Secondary | ICD-10-CM | POA: Insufficient documentation

## 2023-10-27 DIAGNOSIS — R0683 Snoring: Secondary | ICD-10-CM | POA: Insufficient documentation

## 2023-10-30 DIAGNOSIS — R0683 Snoring: Secondary | ICD-10-CM | POA: Diagnosis not present

## 2023-10-30 NOTE — Procedures (Signed)
   Patient Name: Grace Carson, Grace Carson Date: 10/27/2023 Gender: Female D.O.B: [birth year] Age (years): 46 Referring Provider: Norva Riffle Height (inches): 65 Interpreting Physician: Jetty Duhamel MD, ABSM Weight (lbs): 250 RPSGT: Lowry Ram BMI: 42 MRN: [id number] Neck Size: 14.75  CLINICAL INFORMATION Sleep Study Type: NPSG Indication for sleep study: Fatigue, Morbid Obesity, Snoring Epworth Sleepiness Score: 4  SLEEP STUDY TECHNIQUE As per the AASM Manual for the Scoring of Sleep and Associated Events v2.3 (April 2016) with a hypopnea requiring 4% desaturations.  The channels recorded and monitored were frontal, central and occipital EEG, electrooculogram (EOG), submentalis EMG (chin), nasal and oral airflow, thoracic and abdominal wall motion, anterior tibialis EMG, snore microphone, electrocardiogram, and pulse oximetry.  MEDICATIONS Medications self-administered by patient taken the night of the study : none reported  SLEEP ARCHITECTURE The study was initiated at 10:31:08 PM and ended at 5:33:19 AM.  Sleep onset time was 32.4 minutes and the sleep efficiency was 84.7%. The total sleep time was 357.5 minutes.  Stage REM latency was 71.0 minutes.  The patient spent 1.7% of the night in stage N1 sleep, 74.3% in stage N2 sleep, 2.2% in stage N3 and 21.8% in REM.  Alpha intrusion was absent.  Supine sleep was 0.00%.  RESPIRATORY PARAMETERS The overall apnea/hypopnea index (AHI) was 4.9 per hour. There were 12 total apneas, including 11 obstructive, 1 central and 0 mixed apneas. There were 17 hypopneas and 16 RERAs.  The AHI during Stage REM sleep was 16.2 per hour.  AHI while supine was N/A per hour.  The mean oxygen saturation was 93.7%. The minimum SpO2 during sleep was 82.0%.  loud snoring was noted during this study.  CARDIAC DATA The 2 lead EKG demonstrated sinus rhythm. The mean heart rate was 85.7 beats per minute. Other EKG findings include:  PVCs.  LEG MOVEMENT DATA The total PLMS were 0 with a resulting PLMS index of 0.0. Associated arousal with leg movement index was 0.2 .  IMPRESSIONS - Occasional obstructive sleep apnea, at upper limit of normal (AHI = 4.9/h). - No significant central sleep apnea occurred during this study (CAI = 0.2/h). - Oxygen desaturation was noted during this study (Min O2 = 82.0%, Mean 93.7%). - The patient snored with loud snoring volume. - EKG findings include PVCs. - Clinically significant periodic limb movements did not occur during sleep. No significant associated arousals.  DIAGNOSIS - Primary Snoring  RECOMMENDATIONS - Manage for snoring and symptoms based on clinical judgment. - Be careful with alcohol, sedatives and other CNS depressants that may worsen sleep apnea and disrupt normal sleep architecture. - Sleep hygiene should be reviewed to assess factors that may improve sleep quality. - Weight management and regular exercise should be initiated or continued if appropriate.  [Electronically signed] 10/30/2023 12:12 PM  Jetty Duhamel MD, ABSM Diplomate, American Board of Sleep Medicine NPI: [id number]                           Jetty Duhamel Diplomate, American Board of Sleep Medicine  ELECTRONICALLY SIGNED ON:  10/30/2023, 11:53 AM [facility] PH: [phone]   FX: [phone] ACCREDITED BY THE AMERICAN ACADEMY OF SLEEP MEDICINE

## 2024-05-23 LAB — HM MAMMOGRAPHY

## 2024-08-11 LAB — HM PAP SMEAR: HM Pap smear: NORMAL

## 2024-11-13 ENCOUNTER — Encounter: Payer: Self-pay | Admitting: Nurse Practitioner

## 2024-11-13 ENCOUNTER — Ambulatory Visit: Payer: MEDICAID | Admitting: Nurse Practitioner

## 2024-11-13 VITALS — BP 122/84 | HR 74 | Temp 97.2°F | Ht 65.0 in | Wt 265.0 lb

## 2024-11-13 DIAGNOSIS — E785 Hyperlipidemia, unspecified: Secondary | ICD-10-CM | POA: Diagnosis not present

## 2024-11-13 DIAGNOSIS — R7303 Prediabetes: Secondary | ICD-10-CM | POA: Diagnosis not present

## 2024-11-13 DIAGNOSIS — Z23 Encounter for immunization: Secondary | ICD-10-CM

## 2024-11-13 DIAGNOSIS — F32A Depression, unspecified: Secondary | ICD-10-CM | POA: Diagnosis not present

## 2024-11-13 DIAGNOSIS — R41840 Attention and concentration deficit: Secondary | ICD-10-CM | POA: Diagnosis not present

## 2024-11-13 DIAGNOSIS — M1990 Unspecified osteoarthritis, unspecified site: Secondary | ICD-10-CM

## 2024-11-13 DIAGNOSIS — H00015 Hordeolum externum left lower eyelid: Secondary | ICD-10-CM | POA: Diagnosis not present

## 2024-11-13 DIAGNOSIS — F419 Anxiety disorder, unspecified: Secondary | ICD-10-CM

## 2024-11-13 NOTE — Progress Notes (Unsigned)
 New Patient Visit  BP 122/84 (BP Location: Right Arm, Cuff Size: Large)   Pulse 74   Temp (!) 97.2 F (36.2 C)   Ht 5' 5 (1.651 m)   Wt 265 lb (120.2 kg)   SpO2 99%   BMI 44.10 kg/m    Subjective:    Patient ID: Grace Carson, female    DOB: [birth year], 48 y.o.   MRN: [id number]  CC: Chief Complaint  Patient presents with   Establish Care    NP. Est. Care, concerns with remembering things, ADHD, swelling in left eye    HPI: Grace Carson is a 48 y.o. female presents for new patient visit to establish care.  Introduced to publishing rights manager role and practice setting.  All questions answered.  Discussed provider/patient relationship and expectations.   Discussed the use of AI scribe software for clinical note transcription with the patient, who gave verbal consent to proceed.  History of Present Illness   Grace Carson Grace Carson is a 48 year old female who presents to establish care with memory and focus issues.  She experiences significant memory and focus issues, including forgetting tasks, misplacing items, and difficulty completing tasks. These symptoms are worsening over time. Anxiety and depression are present, with daily anxiety attacks and situational anxiety, particularly when driving on the highway. Sleep patterns are irregular, often staying awake until early morning. She is following with a therapist and psychiatrist and is taking xanax  0.5mg  daily as needed (only if she hasn't taken the pain medication). She has mentioned to her psychiatrist about wanting ADHD testing. She denies confusion, slurred speech, and weakness on one side of her body.   She has been informed of borderline heart disease due to high cholesterol and struggles with weight management, eating one meal a day and consuming ice cream regularly. 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 She uses Percocet for pain. She is following with  [facility] for pain management.   Swelling in the left eye has been present for four days, possibly related to cleaning without gloves. Warm compresses are being used. She quit smoking three years ago and now vapes. Alcohol consumption is minimal. She lives with two children and cares for her father.        11/13/2024    1:17 PM  Depression screen PHQ 2/9  Decreased Interest 2  Down, Depressed, Hopeless 1  PHQ - 2 Score 3  Altered sleeping 1  Tired, decreased energy 2  Change in appetite 3  Feeling bad or failure about yourself  1  Trouble concentrating 3  Moving slowly or fidgety/restless 1  Suicidal thoughts 0  PHQ-9 Score 14  Difficult doing work/chores Very difficult      11/13/2024    1:19 PM  GAD 7 : Generalized Anxiety Score  Nervous, Anxious, on Edge 3  Control/stop worrying 1  Worry too much - different things 1  Trouble relaxing 2  Restless 1  Easily annoyed or irritable 3  Afraid - awful might happen 1  Total GAD 7 Score 12  Anxiety Difficulty Very difficult    Past Medical History:  Diagnosis Date   Arthritis    Bipolar 1 disorder (HCC)    Depression    Hyperlipidemia    Post traumatic stress disorder (PTSD)    Social anxiety disorder     Past Surgical History:  Procedure Laterality Date   CESAREAN SECTION  TONSILLECTOMY     TOOTH EXTRACTION     TOTAL HIP ARTHROPLASTY Right 08/11/2022   Procedure: TOTAL HIP ARTHROPLASTY ANTERIOR APPROACH;  Surgeon: Beverley Evalene BIRCH, MD;  Location: [facility];  Service: Orthopedics;  Laterality: Right;    Family History  Problem Relation Age of Onset   Diabetes Mother    Diabetes Maternal Aunt    Cancer Maternal Uncle    Diabetes Maternal Grandmother    Heart disease Maternal Grandmother    Diabetes Maternal Grandfather    Heart disease Maternal Grandfather      Social History   Tobacco Use   Smoking status: Former    Current packs/day: 0.50    Average packs/day: 0.5 packs/day for 20.0  years (10.0 ttl pk-yrs)    Types: Cigarettes   Smokeless tobacco: Never  Vaping Use   Vaping status: Every Day   Start date: 05/06/2021   Substances: Nicotine, Flavoring  Substance Use Topics   Alcohol use: Yes    Comment: sparingly   Drug use: No    Current Outpatient Medications on File Prior to Visit  Medication Sig Dispense Refill   ALPRAZolam  (XANAX  XR) 0.5 MG 24 hr tablet Take 0.5 mg by mouth daily. (Patient taking differently: Take 0.5 mg by mouth as needed.)     omeprazole (PRILOSEC) 40 MG capsule Take 40 mg by mouth daily.     oxyCODONE -acetaminophen  (PERCOCET) 10-325 MG tablet Take 1 tablet by mouth 5 (five) times daily as needed for pain.     acetaminophen  (TYLENOL ) 500 MG tablet Take 500 mg by mouth 3 (three) times daily as needed (pain.). (Patient not taking: Reported on 11/13/2024)     aspirin  EC 81 MG tablet Take 1 tablet (81 mg total) by mouth 2 (two) times daily. To prevent blood clots for 30 days after surgery. 60 tablet 0   Cyanocobalamin (VITAMIN B12 TR PO) Take 1 tablet by mouth daily. (Patient not taking: Reported on 11/13/2024)     oxyCODONE  (ROXICODONE ) 5 MG immediate release tablet Take 1 tablet (5 mg total) by mouth every 6 (six) hours as needed for breakthrough pain (after recent surgery that is not resolved by your normal daily dose of Percocet). (Patient not taking: Reported on 11/13/2024) 28 tablet 0   No current facility-administered medications on file prior to visit.     Review of Systems  Constitutional: Negative.   HENT: Negative.    Eyes:  Positive for visual disturbance. Negative for pain and redness.       Left eyelid swelling  Respiratory: Negative.    Cardiovascular:  Positive for palpitations (with anxiety). Negative for chest pain.  Gastrointestinal: Negative.   Endocrine: Negative.   Genitourinary: Negative.   Musculoskeletal:  Positive for arthralgias.  Skin: Negative.   Neurological:  Positive for headaches. Negative for dizziness.   Psychiatric/Behavioral:  The patient is nervous/anxious.       Objective:    BP 122/84 (BP Location: Right Arm, Cuff Size: Large)   Pulse 74   Temp (!) 97.2 F (36.2 C)   Ht 5' 5 (1.651 m)   Wt 265 lb (120.2 kg)   SpO2 99%   BMI 44.10 kg/m   Wt Readings from Last 3 Encounters:  11/13/24 265 lb (120.2 kg)  10/27/23 250 lb (113.4 kg)  08/11/22 213 lb 13.5 oz (97 kg)    BP Readings from Last 3 Encounters:  11/13/24 122/84  08/12/22 127/73  07/31/22 125/71    Physical Exam Vitals and nursing note reviewed.  Constitutional:      General: She is not in acute distress.    Appearance: Normal appearance.  HENT:     Head: Normocephalic and atraumatic.     Right Ear: Tympanic membrane, ear canal and external ear normal.     Left Ear: Tympanic membrane, ear canal and external ear normal.     Mouth/Throat:     Mouth: Mucous membranes are moist.     Pharynx: No posterior oropharyngeal erythema.  Eyes:     Conjunctiva/sclera: Conjunctivae normal.  Cardiovascular:     Rate and Rhythm: Normal rate and regular rhythm.     Pulses: Normal pulses.     Heart sounds: Normal heart sounds.  Pulmonary:     Effort: Pulmonary effort is normal.     Breath sounds: Normal breath sounds.  Abdominal:     Palpations: Abdomen is soft.     Tenderness: There is no abdominal tenderness.  Musculoskeletal:        General: Normal range of motion.     Cervical back: Normal range of motion and neck supple.     Right lower leg: No edema.     Left lower leg: No edema.  Lymphadenopathy:     Cervical: No cervical adenopathy.  Skin:    General: Skin is warm and dry.  Neurological:     General: No focal deficit present.     Mental Status: She is alert and oriented to person, place, and time.     Cranial Nerves: No cranial nerve deficit.     Coordination: Coordination normal.     Gait: Gait normal.  Psychiatric:        Mood and Affect: Mood normal.        Behavior: Behavior normal.        Thought  Content: Thought content normal.        Judgment: Judgment normal.        Assessment & Plan:   Problem List Items Addressed This Visit       Musculoskeletal and Integument   Arthritis   Chronic pain is managed with Percocet. Continue collaboration and recommendations from pain management.         Other   Anxiety and depression - Primary   She experiences frequent anxiety attacks affecting daily life and sleep. Xanax  0.25mg  daily as needed is used as needed for severe anxiety attacks, limited by pain medication.      Impaired concentration   She exhibits significant memory and attention deficits with suspected ADHD and has not had a recent psychiatric evaluation. Recommend she reach out to her psychiatrist or therapist for ADHD testing. Encouraged use of calendar and alarms for reminders.      Hyperlipidemia   She has borderline cholesterol levels with a family history of heart disease. Requested records from [facility] to review cholesterol levels and encouraged dietary modifications to manage cholesterol.      Prediabetes   Chronic, stable. Discussed nutrition, exercise. Requesting labs from [facility].       Morbid obesity (HCC)   She has long-standing weight issues with current dietary habits contributing. Encouraged dietary modifications, including eating small snacks during the day, to hopefully lessen binging later in the day.       Other Visit Diagnoses       Hordeolum externum of left lower eyelid       A stye is causing soreness and swelling. Continue warm compresses four to five times daily for ten minutes  each.     Immunization due       Tdap given today   Relevant Orders   Tdap vaccine greater than or equal to 7yo IM (Completed)        Follow up plan: Return in about 3 months (around 02/13/2025) for HLD.  Roch Quach A Seena Ritacco

## 2024-11-13 NOTE — Patient Instructions (Signed)
 It was great to see you!  Reach out to your psychiatrist to request adhd testing   Keep using warm compresses 4-5 times per day for your eye   Try switching to frozen yogurt instead of ice cream   Try snacking regularly throughout the day to help with binging later  Let's follow-up in 3 months, sooner if you have concerns.  If a referral was placed today, you will be contacted for an appointment. Please note that routine referrals can sometimes take up to 3-4 weeks to process. Please call our office if you haven't heard anything after this time frame.  Take care,  Tinnie Harada, NP

## 2024-11-14 DIAGNOSIS — F419 Anxiety disorder, unspecified: Secondary | ICD-10-CM | POA: Insufficient documentation

## 2024-11-14 DIAGNOSIS — R41840 Attention and concentration deficit: Secondary | ICD-10-CM | POA: Insufficient documentation

## 2024-11-14 DIAGNOSIS — E785 Hyperlipidemia, unspecified: Secondary | ICD-10-CM | POA: Insufficient documentation

## 2024-11-14 DIAGNOSIS — R7303 Prediabetes: Secondary | ICD-10-CM | POA: Insufficient documentation

## 2024-11-14 DIAGNOSIS — M1990 Unspecified osteoarthritis, unspecified site: Secondary | ICD-10-CM | POA: Insufficient documentation

## 2024-11-14 NOTE — Assessment & Plan Note (Signed)
 She exhibits significant memory and attention deficits with suspected ADHD and has not had a recent psychiatric evaluation. Recommend she reach out to her psychiatrist or therapist for ADHD testing. Encouraged use of calendar and alarms for reminders.

## 2024-11-14 NOTE — Assessment & Plan Note (Signed)
 She has borderline cholesterol levels with a family history of heart disease. Requested records from [facility] to review cholesterol levels and encouraged dietary modifications to manage cholesterol.

## 2024-11-14 NOTE — Assessment & Plan Note (Signed)
 She experiences frequent anxiety attacks affecting daily life and sleep. Xanax  0.25mg  daily as needed is used as needed for severe anxiety attacks, limited by pain medication.

## 2024-11-14 NOTE — Assessment & Plan Note (Signed)
 Chronic pain is managed with Percocet. Continue collaboration and recommendations from pain management.

## 2024-11-14 NOTE — Assessment & Plan Note (Signed)
 She has long-standing weight issues with current dietary habits contributing. Encouraged dietary modifications, including eating small snacks during the day, to hopefully lessen binging later in the day.

## 2024-11-14 NOTE — Assessment & Plan Note (Signed)
 Chronic, stable. Discussed nutrition, exercise. Requesting labs from [facility].

## 2025-02-13 ENCOUNTER — Ambulatory Visit: Payer: MEDICAID | Admitting: Nurse Practitioner
